# Patient Record
Sex: Male | Born: 1955 | Race: Black or African American | Hispanic: No | Marital: Single | Smoking: Never smoker
Health system: Southern US, Community
[De-identification: ages and names within clinical notes are randomized; demographics above are authoritative.]

## PROBLEM LIST (undated history)

## (undated) DIAGNOSIS — I10 Essential (primary) hypertension: Secondary | ICD-10-CM

## (undated) DIAGNOSIS — R569 Unspecified convulsions: Secondary | ICD-10-CM

## (undated) DIAGNOSIS — D649 Anemia, unspecified: Secondary | ICD-10-CM

## (undated) HISTORY — DX: Anemia, unspecified: D64.9

## (undated) HISTORY — DX: Essential (primary) hypertension: I10

## (undated) HISTORY — DX: Unspecified convulsions: R56.9

---

## 2001-06-24 ENCOUNTER — Encounter: Admission: RE | Admit: 2001-06-24 | Discharge: 2001-06-24 | Payer: Self-pay | Admitting: Family Medicine

## 2001-06-24 ENCOUNTER — Encounter: Payer: Self-pay | Admitting: Family Medicine

## 2005-03-07 ENCOUNTER — Encounter: Admission: RE | Admit: 2005-03-07 | Discharge: 2005-03-07 | Payer: Self-pay | Admitting: Family Medicine

## 2011-09-05 ENCOUNTER — Encounter: Payer: Self-pay | Admitting: Gastroenterology

## 2011-10-09 ENCOUNTER — Ambulatory Visit (AMBULATORY_SURGERY_CENTER): Payer: Medicare Other

## 2011-10-09 ENCOUNTER — Encounter: Payer: Self-pay | Admitting: Gastroenterology

## 2011-10-09 VITALS — Ht 74.0 in | Wt 154.2 lb

## 2011-10-09 DIAGNOSIS — Z1211 Encounter for screening for malignant neoplasm of colon: Secondary | ICD-10-CM

## 2011-10-09 DIAGNOSIS — Z800 Family history of malignant neoplasm of digestive organs: Secondary | ICD-10-CM

## 2011-10-09 MED ORDER — PEG-KCL-NACL-NASULF-NA ASC-C 100 G PO SOLR: 1.0000 | Freq: Once | ORAL | Status: AC

## 2011-10-09 NOTE — Progress Notes (Signed)
Pt came into office today for his pre-visit prior to the colonoscopy with Dr Arlyce Dice on 10/23/11.His niece Elmarie Shiley ) was with him to help explain medical information, but will call back later with a list of meds. His brother Caryn Bee ) will come with him to his colonoscopy appt to help answer questions and explain paperwork. Ulis Rias RN

## 2011-10-23 ENCOUNTER — Ambulatory Visit (AMBULATORY_SURGERY_CENTER): Payer: Medicare Other | Admitting: Gastroenterology

## 2011-10-23 ENCOUNTER — Encounter: Payer: Self-pay | Admitting: Gastroenterology

## 2011-10-23 VITALS — BP 135/84 | HR 62 | Temp 96.3°F | Resp 17 | Ht 74.0 in | Wt 154.0 lb

## 2011-10-23 DIAGNOSIS — D126 Benign neoplasm of colon, unspecified: Secondary | ICD-10-CM

## 2011-10-23 DIAGNOSIS — Z1211 Encounter for screening for malignant neoplasm of colon: Secondary | ICD-10-CM

## 2011-10-23 MED ORDER — SODIUM CHLORIDE 0.9 % IV SOLN: 500.0000 mL | INTRAVENOUS | Status: DC

## 2011-10-23 NOTE — Progress Notes (Signed)
Patient did not experience any of the following events: a burn prior to discharge; a fall within the facility; wrong site/side/patient/procedure/implant event; or a hospital transfer or hospital admission upon discharge from the facility. (G8907) Patient did not have preoperative order for IV antibiotic SSI prophylaxis. (G8918)  

## 2011-10-23 NOTE — Progress Notes (Signed)
The pt tolerated the colonoscopy very well. Maw   

## 2011-10-23 NOTE — Patient Instructions (Signed)
YOU HAD AN ENDOSCOPIC PROCEDURE TODAY AT THE [facility]: Refer to the procedure report that was given to you for any specific questions about what was found during the examination.  If the procedure report does not answer your questions, please call your gastroenterologist to clarify.  If you requested that your care partner not be given the details of your procedure findings, then the procedure report has been included in a sealed envelope for you to review at your convenience later.  YOU SHOULD EXPECT: Some feelings of bloating in the abdomen. Passage of more gas than usual.  Walking can help get rid of the air that was put into your GI tract during the procedure and reduce the bloating. If you had a lower endoscopy (such as a colonoscopy or flexible sigmoidoscopy) you may notice spotting of blood in your stool or on the toilet paper. If you underwent a bowel prep for your procedure, then you may not have a normal bowel movement for a few days.  DIET: Your first meal following the procedure should be a light meal and then it is ok to progress to your normal diet.  A half-sandwich or bowl of soup is an example of a good first meal.  Heavy or fried foods are harder to digest and may make you feel nauseous or bloated.  Likewise meals heavy in dairy and vegetables can cause extra gas to form and this can also increase the bloating.  Drink plenty of fluids but you should avoid alcoholic beverages for 24 hours.  ACTIVITY: Your care partner should take you home directly after the procedure.  You should plan to take it easy, moving slowly for the rest of the day.  You can resume normal activity the day after the procedure however you should NOT DRIVE or use heavy machinery for 24 hours (because of the sedation medicines used during the test).    SYMPTOMS TO REPORT IMMEDIATELY: A gastroenterologist can be reached at any hour.  During normal business hours, 8:30 AM to 5:00 PM Monday through Friday,  call [phone].  After hours and on weekends, please call the GI answering service at [phone] who will take a message and have the physician on call contact you.   Following lower endoscopy (colonoscopy or flexible sigmoidoscopy):  Excessive amounts of blood in the stool  Significant tenderness or worsening of abdominal pains  Swelling of the abdomen that is new, acute  Fever of 100F or higher    FOLLOW UP: If any biopsies were taken you will be contacted by phone or by letter within the next 1-3 weeks.  Call your gastroenterologist if you have not heard about the biopsies in 3 weeks.  Our staff will call the home number listed on your records the next business day following your procedure to check on you and address any questions or concerns that you may have at that time regarding the information given to you following your procedure. This is a courtesy call and so if there is no answer at the home number and we have not heard from you through the emergency physician on call, we will assume that you have returned to your regular daily activities without incident.  SIGNATURES/CONFIDENTIALITY: You and/or your care partner have signed paperwork which will be entered into your electronic medical record.  These signatures attest to the fact that that the information above on your After Visit Summary has been reviewed and is understood.  Full responsibility of the confidentiality   of this discharge information lies with you and/or your care-partner.     

## 2011-10-23 NOTE — Progress Notes (Signed)
Propofol was administered by Sheila Camp, CRNA.  Maw   

## 2011-10-23 NOTE — Op Note (Signed)
[facility] [address] [place], [place]  [address]  COLONOSCOPY PROCEDURE REPORT  PATIENT:  Gregory, Spence  MR#:  [id number] BIRTHDATE:  [birth year], 55 yrs. old  GENDER:  male ENDOSCOPIST:  Barbette Hair. Arlyce Dice, MD REF. BY:  Clyda Greener, M.D. PROCEDURE DATE:  10/23/2011 PROCEDURE:  Colonoscopy with snare polypectomy ASA CLASS:  Class II INDICATIONS:  Routine Risk Screening MEDICATIONS:   MAC sedation, administered by CRNA propofol 250mg IV  DESCRIPTION OF PROCEDURE:   After the risks benefits and alternatives of the procedure were thoroughly explained, informed consent was obtained.  Digital rectal exam was performed and revealed no abnormalities.   The LB PCF-[id number] endoscope was introduced through the anus and advanced to the cecum, which was identified by both the appendix and ileocecal valve, without limitations.  The quality of the prep was excellent, using MoviPrep.  The instrument was then slowly withdrawn as the colon was fully examined. <<PROCEDUREIMAGES>>  FINDINGS:  A sessile polyp was found. It was 3 mm in size. Flat polyp Polyp was snared without cautery. Retrieval was successful (see image3). snare polyp  This was otherwise a normal examination of the colon (see image1 and image4).   Retroflexed views in the rectum revealed no abnormalities.    The time to cecum =  1) 3.75 minutes. The scope was then withdrawn in  1) 12.75  minutes from the cecum and the procedure completed. COMPLICATIONS:  None ENDOSCOPIC IMPRESSION: 1) 3 mm sessile polyp 2) Otherwise normal examination RECOMMENDATIONS: 1) If the polyp(s) removed today are proven to be adenomatous (pre-cancerous) polyps, you will need a repeat colonoscopy in 5 years. Otherwise you should continue to follow colorectal cancer screening guidelines for "routine risk" patients with colonoscopy in 10 years. You will receive a letter within 1-2 weeks with the results of your biopsy as well as final  recommendations. Please call my office if you have not received a letter after 3 weeks. REPEAT EXAM:   You will receive a letter from Dr. Arlyce Dice in 1-2 weeks, after reviewing the final pathology, with followup recommendations.  ______________________________ Barbette Hair Arlyce Dice, MD  CC:  n. eSIGNED:   Barbette Hair. Jenasis Straley at 10/23/2011 10:45 AM  Gardiner Sleeper, [phone]

## 2011-10-24 ENCOUNTER — Telehealth: Payer: Self-pay | Admitting: *Deleted

## 2011-10-24 NOTE — Telephone Encounter (Signed)
  Follow up Call-  Call back number 10/23/2011  Post procedure Call Back phone  # [phone]  Permission to leave phone message Yes     Pt still a sleep, left message to have him call us back with any questions or problems.

## 2011-10-31 ENCOUNTER — Encounter: Payer: Self-pay | Admitting: Gastroenterology

## 2012-11-19 ENCOUNTER — Ambulatory Visit: Payer: Medicare Other | Admitting: Internal Medicine

## 2012-11-24 ENCOUNTER — Ambulatory Visit: Payer: Medicare Other | Admitting: Internal Medicine

## 2013-09-25 ENCOUNTER — Encounter (INDEPENDENT_AMBULATORY_CARE_PROVIDER_SITE_OTHER): Payer: Self-pay

## 2013-09-25 ENCOUNTER — Ambulatory Visit (INDEPENDENT_AMBULATORY_CARE_PROVIDER_SITE_OTHER): Payer: Medicare Other | Admitting: Diagnostic Neuroimaging

## 2013-09-25 ENCOUNTER — Ambulatory Visit: Payer: Medicare Other | Admitting: Diagnostic Neuroimaging

## 2013-09-25 ENCOUNTER — Encounter: Payer: Self-pay | Admitting: Diagnostic Neuroimaging

## 2013-09-25 VITALS — BP 126/74 | HR 57 | Temp 98.3°F | Ht 72.0 in | Wt 160.0 lb

## 2013-09-25 DIAGNOSIS — F70 Mild intellectual disabilities: Secondary | ICD-10-CM | POA: Insufficient documentation

## 2013-09-25 DIAGNOSIS — G40909 Epilepsy, unspecified, not intractable, without status epilepticus: Secondary | ICD-10-CM

## 2013-09-25 NOTE — Patient Instructions (Signed)
Continue current medications. 

## 2013-09-25 NOTE — Progress Notes (Signed)
[facility]  PATIENT: Gregory Spence DOB: June 29, 1955  REFERRING CLINICIAN: Blount HISTORY FROM: patient, twin brother (monozygotic), girlfriend REASON FOR VISIT: new consult   HISTORICAL  CHIEF COMPLAINT:  Chief Complaint  Patient presents with  . Seizures    HISTORY OF PRESENT ILLNESS:   58 year old male with history of mild mental retardation, static encephalopathy, mild congential left hemiparesis, seizure disorder, here to establish again for seizure disorder management.  Patient takes carbamazepine 200 mg tablets, 2.5 tablets twice a day. Patient has not had a seizure in over 20 years (last seizure 1990?). Otherwise patient is doing well. 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  First seizure of life around age 59 years old, with generalized convulsive seizures. No warning sign for seizures. Patient was initially on Dilantin and then transition to carbamazepine. No seizure since starting carbamazepine.  REVIEW OF SYSTEMS: Full 14 system review of systems performed and notable only for confusion, headache seizure d/o diarrhea weight loss poor appetite.  ALLERGIES: No Known Allergies  HOME MEDICATIONS: Outpatient Prescriptions Prior to Visit  Medication Sig Dispense Refill  . atenolol (TENORMIN) 25 MG tablet       . atorvastatin (LIPITOR) 10 MG tablet Take 10 mg by mouth daily.      . carbamazepine (TEGRETOL) 200 MG tablet Take 200 mg by mouth daily.      . Fe Fumarate-B12-Vit C-FA-IFC (FEROCON PO) Take by mouth daily.      . hydrochlorothiazide (HYDRODIURIL) 25 MG tablet Take 25 mg by mouth daily.       No facility-administered medications prior to visit.    PAST MEDICAL HISTORY: Past Medical History  Diagnosis Date  . Seizures   . Hypertension     PAST SURGICAL HISTORY: History reviewed. No pertinent past surgical history.  FAMILY HISTORY: Family History  Problem Relation Age of Onset  . Heart disease Mother   . Diabetes Mother     . Other Mother     infection  . Colon cancer Sister   . Colon cancer Brother     SOCIAL HISTORY:  History   Social History  . Marital Status: Single    Spouse Name: N/A    Number of Children: 0  . Years of Education: 12th   Occupational History  .     Social History Main Topics  . Smoking status: Never Smoker   . Smokeless tobacco: Never Used  . Alcohol Use: No  . Drug Use: No  . Sexual Activity: Not on file   Other Topics Concern  . Not on file   Social History Narrative   Patient lives at home with father, twin brother, sister.   Caffeine Use: 2 sodas daily   Never used EtOH, tobacco or illicit drugs.   Attended special education classes; graduated from 12th grade.     PHYSICAL EXAM  Filed Vitals:   09/25/13 1000  BP: 126/74  Pulse: 57  Temp: 98.3 F (36.8 C)  TempSrc: Oral  Height: 6' (1.829 m)  Weight: 160 lb (72.576 kg)    Not recorded    Body mass index is 21.7 kg/(m^2).  GENERAL EXAM: Patient is in no distress; well developed, nourished and groomed; neck is supple  CARDIOVASCULAR: Regular rate and rhythm, no murmurs, no carotid bruits  NEUROLOGIC: MENTAL STATUS: awake, alert, SLOW RESPONSES, MILD MR, PLEASANT AND APPROPRIATE. CRANIAL NERVE: pupils equal and reactive to light, visual fields full to confrontation, extraocular muscles intact, no nystagmus, facial sensation and strength  symmetric, hearing intact, palate elevates symmetrically, uvula midline, shoulder shrug symmetric, tongue midline. MOTOR: normal bulk and tone, full strength in the BUE, BLE SENSORY: normal and symmetric to light touch, pinprick, temperature, vibration and proprioception COORDINATION: finger-nose-finger, fine finger movements, SLIGHTLY CLUMSY IN LEFT HAND/ARM REFLEXES: deep tendon reflexes present and symmetric GAIT/STATION: narrow based gait; romberg is negative    DIAGNOSTIC DATA (LABS, IMAGING, TESTING) - I reviewed patient records, labs, notes, testing  and imaging myself where available.  No results found for this basename: WBC, HGB, HCT, MCV, PLT   No results found for this basename: na, k, cl, co2, glucose, bun, creatinine, calcium, prot, albumin, ast, alt, alkphos, bilitot, gfrnonaa, gfraa   No results found for this basename: CHOL, HDL, LDLCALC, LDLDIRECT, TRIG, CHOLHDL   No results found for this basename: HGBA1C   No results found for this basename: VITAMINB12   No results found for this basename: TSH    ASSESSMENT AND PLAN  58 y.o. year old male here with mild mental retardation, static encephalopathy, mild congential left hemiparesis, seizure disorder. Doing well on CBZ 500mg  BID (=200mg  tabs --> 2.5 tabs BID). No seizures since 1990. Would recommend continuing current dose. Needs annual CBC, LFTs.   PLAN: - continue current medications (CBZ 500mg  BID (=200mg  tabs --> 2.5 tabs BID)  Return in about 1 year (around 09/26/2014).   Penni Bombard, MD 09/29/5679, 27:51 AM Certified in Neurology, Neurophysiology and Neuroimaging  [facility] [address] [place], [place] [address] [phone]

## 2013-11-19 ENCOUNTER — Telehealth: Payer: Self-pay | Admitting: Internal Medicine

## 2013-11-19 NOTE — Telephone Encounter (Signed)
C/D 11/19/13 for appt. 12/07/13

## 2013-11-19 NOTE — Telephone Encounter (Signed)
S/W PATIENT AND GAVE NP APPT FOR 06/15 @ 1:30 W/DR. CHISM.  REFERRING DR. Ollen Gross BLOUNT DX- LEUKOPENIA/ANEMIA WELCOME PACKET  MAILED.

## 2013-12-07 ENCOUNTER — Ambulatory Visit: Payer: Medicare Other

## 2013-12-07 ENCOUNTER — Other Ambulatory Visit: Payer: Medicare Other

## 2013-12-14 ENCOUNTER — Other Ambulatory Visit: Payer: Medicare Other

## 2013-12-14 ENCOUNTER — Ambulatory Visit: Payer: Medicare Other

## 2013-12-17 ENCOUNTER — Ambulatory Visit (HOSPITAL_BASED_OUTPATIENT_CLINIC_OR_DEPARTMENT_OTHER): Payer: Medicare Other | Admitting: Internal Medicine

## 2013-12-17 ENCOUNTER — Telehealth: Payer: Self-pay | Admitting: Internal Medicine

## 2013-12-17 ENCOUNTER — Other Ambulatory Visit (HOSPITAL_BASED_OUTPATIENT_CLINIC_OR_DEPARTMENT_OTHER): Payer: Medicare Other

## 2013-12-17 ENCOUNTER — Ambulatory Visit: Payer: Medicare Other

## 2013-12-17 ENCOUNTER — Other Ambulatory Visit: Payer: Self-pay | Admitting: Internal Medicine

## 2013-12-17 ENCOUNTER — Encounter: Payer: Self-pay | Admitting: Internal Medicine

## 2013-12-17 VITALS — BP 133/70 | HR 65 | Temp 97.2°F | Resp 18 | Ht 72.0 in | Wt 158.4 lb

## 2013-12-17 DIAGNOSIS — D649 Anemia, unspecified: Secondary | ICD-10-CM

## 2013-12-17 DIAGNOSIS — D72819 Decreased white blood cell count, unspecified: Secondary | ICD-10-CM

## 2013-12-17 DIAGNOSIS — F79 Unspecified intellectual disabilities: Secondary | ICD-10-CM

## 2013-12-17 LAB — CBC WITH DIFFERENTIAL/PLATELET
BASO%: 0.8 % (ref 0.0–2.0)
BASOS ABS: 0 10*3/uL (ref 0.0–0.1)
EOS%: 6.5 % (ref 0.0–7.0)
Eosinophils Absolute: 0.3 10*3/uL (ref 0.0–0.5)
HCT: 34.6 % — ABNORMAL LOW (ref 38.4–49.9)
HEMOGLOBIN: 11.8 g/dL — AB (ref 13.0–17.1)
LYMPH#: 1.3 10*3/uL (ref 0.9–3.3)
LYMPH%: 33.5 % (ref 14.0–49.0)
MCH: 28.6 pg (ref 27.2–33.4)
MCHC: 34.1 g/dL (ref 32.0–36.0)
MCV: 84 fL (ref 79.3–98.0)
MONO#: 0.4 10*3/uL (ref 0.1–0.9)
MONO%: 10.1 % (ref 0.0–14.0)
NEUT#: 1.9 10*3/uL (ref 1.5–6.5)
NEUT%: 49.1 % (ref 39.0–75.0)
Platelets: 301 10*3/uL (ref 140–400)
RBC: 4.12 10*6/uL — ABNORMAL LOW (ref 4.20–5.82)
RDW: 12.9 % (ref 11.0–14.6)
WBC: 3.9 10*3/uL — AB (ref 4.0–10.3)
nRBC: 0 % (ref 0–0)

## 2013-12-17 LAB — COMPREHENSIVE METABOLIC PANEL (CC13)
ALT: 20 U/L (ref 0–55)
ANION GAP: 8 meq/L (ref 3–11)
AST: 25 U/L (ref 5–34)
Albumin: 3.9 g/dL (ref 3.5–5.0)
Alkaline Phosphatase: 83 U/L (ref 40–150)
BUN: 6.8 mg/dL — ABNORMAL LOW (ref 7.0–26.0)
CALCIUM: 9.3 mg/dL (ref 8.4–10.4)
CO2: 27 meq/L (ref 22–29)
CREATININE: 1 mg/dL (ref 0.7–1.3)
Chloride: 105 mEq/L (ref 98–109)
GLUCOSE: 91 mg/dL (ref 70–140)
Potassium: 4.1 mEq/L (ref 3.5–5.1)
Sodium: 140 mEq/L (ref 136–145)
TOTAL PROTEIN: 7.6 g/dL (ref 6.4–8.3)
Total Bilirubin: 0.26 mg/dL (ref 0.20–1.20)

## 2013-12-17 LAB — LACTATE DEHYDROGENASE (CC13): LDH: 167 U/L (ref 125–245)

## 2013-12-17 LAB — IRON AND TIBC CHCC
%SAT: 54 % (ref 20–55)
Iron: 121 ug/dL (ref 42–163)
TIBC: 224 ug/dL (ref 202–409)
UIBC: 103 ug/dL — ABNORMAL LOW (ref 117–376)

## 2013-12-17 LAB — FERRITIN CHCC: Ferritin: 470 ng/ml — ABNORMAL HIGH (ref 22–316)

## 2013-12-17 NOTE — Progress Notes (Signed)
Checked in new patient with no financial issues. He has appt card and has not been out of the country.

## 2013-12-17 NOTE — Progress Notes (Signed)
[facility] [phone]   [phone]  NEW PATIENT EVALUATION   Name: Gregory Spence Date: 12/18/2013 MRN: [id number] DOB: 1955-08-27  PCP: [name], MD   REFERRING PHYSICIAN: [name], *  REASON FOR REFERRAL: Anemia NOS   HISTORY OF PRESENT ILLNESS:Gregory Spence is a 58 y.o. male who is being evaluated at the request of Dr. Kennon Holter for mild anemia.   He is accompanied by his caretaker Laureen Abrahams. He has a longstanding history of mental retardation.   History relied on by medical records.  His last visit with Dr. Kennon Holter was 11/05/2013 for a general follow up visit. He had a low white cell count and low red cell count and hemoglobin and hematocrit and was referred to [place] due to consistency of his WBC and low RBC counts. His WBC on 10/29/2013 was 3.5; Hemoglobin of 11.4; MCV of 82.8.  RDW of 14 and platelet count of 340.  His comprehensive metabolic panel was within normal limits.  He denies any symptoms of anemia including chest pain or shortness of breath. He also denies fatigue as well. 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  He has not gone for the past two months due to lack of additional events required.  His TSH was normal at 1.103 and PSA was 0.88. A prior CBC demonstrated a WBC of 3.7 and hemoglobin of 12.1 with a normal plt count.  CMP was within normal limits. He denies recurrent infections or fevers or chills. His does endorse losing several lbs over the past six months.  He could not quantify.  He denies recent hospitalizations or emergency room visits.  He denies picca.  He had a colonoscopy two years ago on 10/23/2011 by Dr. Deatra Ina with one 3 mm sessile polyp removed which was benign. He reports his mother and brother and sister had malignancies but unsure of the type.    PAST MEDICAL HISTORY:  has a past medical history of Seizures and Hypertension.     PAST SURGICAL HISTORY:No past surgical history  on file.   CURRENT MEDICATIONS: has a current medication list which includes the following prescription(s): atenolol, atorvastatin, carbamazepine, and fe fumarate-b12-vit c-fa-ifc.   ALLERGIES: Review of patient's allergies indicates no known allergies.   SOCIAL HISTORY:  reports that he has never smoked. He has never used smokeless tobacco. He reports that he does not drink alcohol or use illicit drugs.   FAMILY HISTORY: family history includes Colon cancer in his brother and sister; Diabetes in his mother; Heart disease in his mother; Other in his mother.   LABORATORY DATA:  Results for orders placed in visit on 12/17/13 (from the past 48 hour(s))  VITAMIN B12     Status: None   Collection Time    12/17/13 10:26 AM      Result Value Ref Range   Vitamin B-12 554  211 - 911 pg/mL  FOLATE RBC     Status: Abnormal   Collection Time    12/17/13 10:26 AM      Result Value Ref Range   RBC Folate 255 (*) >280 ng/mL   Comment: We are unable to perform the hematocrit due to specimen issues (QNS,stability, frozen sample).  A default hematocrit value of 33% has beenused to calculate the RBC Folate.Reference range not established for pediatric patients.  LACTATE DEHYDROGENASE (CC13)     Status: None   Collection Time    12/17/13 10:26 AM  Result Value Ref Range   LDH 167  125 - 245 U/L  COMPREHENSIVE METABOLIC PANEL (DJ24)     Status: Abnormal   Collection Time    12/17/13 10:26 AM      Result Value Ref Range   Sodium 140  136 - 145 mEq/L   Potassium 4.1  3.5 - 5.1 mEq/L   Chloride 105  98 - 109 mEq/L   CO2 27  22 - 29 mEq/L   Glucose 91  70 - 140 mg/dl   BUN 6.8 (*) 7.0 - 26.0 mg/dL   Creatinine 1.0  0.7 - 1.3 mg/dL   Total Bilirubin 0.26  0.20 - 1.20 mg/dL   Alkaline Phosphatase 83  40 - 150 U/L   AST 25  5 - 34 U/L   ALT 20  0 - 55 U/L   Total Protein 7.6  6.4 - 8.3 g/dL   Albumin 3.9  3.5 - 5.0 g/dL   Calcium 9.3  8.4 - 10.4 mg/dL   Anion Gap 8  3 - 11 mEq/L  FERRITIN  [facility]     Status: Abnormal   Collection Time    12/17/13 10:26 AM      Result Value Ref Range   Ferritin 470 (*) 22 - 316 ng/ml  IRON AND TIBC [facility]     Status: Abnormal   Collection Time    12/17/13 10:26 AM      Result Value Ref Range   Iron 121  42 - 163 ug/dL   TIBC 224  202 - 409 ug/dL   UIBC 103 (*) 117 - 376 ug/dL   %SAT 54  20 - 55 %  CBC WITH DIFFERENTIAL     Status: Abnormal   Collection Time    12/17/13 11:04 AM      Result Value Ref Range   WBC 3.9 (*) 4.0 - 10.3 10e3/uL   NEUT# 1.9  1.5 - 6.5 10e3/uL   HGB 11.8 (*) 13.0 - 17.1 g/dL   HCT 34.6 (*) 38.4 - 49.9 %   Platelets 301  140 - 400 10e3/uL   MCV 84.0  79.3 - 98.0 fL   MCH 28.6  27.2 - 33.4 pg   MCHC 34.1  32.0 - 36.0 g/dL   RBC 4.12 (*) 4.20 - 5.82 10e6/uL   RDW 12.9  11.0 - 14.6 %   lymph# 1.3  0.9 - 3.3 10e3/uL   MONO# 0.4  0.1 - 0.9 10e3/uL   Eosinophils Absolute 0.3  0.0 - 0.5 10e3/uL   Basophils Absolute 0.0  0.0 - 0.1 10e3/uL   NEUT% 49.1  39.0 - 75.0 %   LYMPH% 33.5  14.0 - 49.0 %   MONO% 10.1  0.0 - 14.0 %   EOS% 6.5  0.0 - 7.0 %   BASO% 0.8  0.0 - 2.0 %   nRBC 0  0 - 0 %       RADIOGRAPHY: No results found.     REVIEW OF SYSTEMS:  Constitutional: Denies fevers, chills or abnormal weight loss Eyes: Denies blurriness of vision Ears, nose, mouth, throat, and face: Denies mucositis or sore throat Respiratory: Denies cough, dyspnea or wheezes Cardiovascular: Denies palpitation, chest discomfort or lower extremity swelling Gastrointestinal:  Denies nausea, heartburn or change in bowel habits Skin: Denies abnormal skin rashes Lymphatics: Denies new lymphadenopathy or easy bruising Neurological:Denies numbness, tingling or new weaknesses Behavioral/Psych: Mood is stable, no new changes  All other systems were reviewed with the patient and are  negative.  PHYSICAL EXAM:  height is 6' (1.829 m) and weight is 158 lb 6.4 oz (71.85 kg). His oral temperature is 97.2 F (36.2 C). His blood pressure  is 133/70 and his pulse is 65. His respiration is 18 and oxygen saturation is 99%.    GENERAL:alert, no distress and comfortable; well developed and well nourished.  SKIN: skin color, texture, turgor are normal, no rashes or significant lesions EYES: normal, Conjunctiva are pink and non-injected, sclera clear OROPHARYNX:no exudate, no erythema and lips, buccal mucosa, and tongue normal  NECK: supple, thyroid normal size, non-tender, without nodularity LYMPH:  no palpable lymphadenopathy in the cervical, axillary or inguinal LUNGS: clear to auscultation and percussion with normal breathing effort HEART: regular rate & rhythm and no murmurs and no lower extremity edema ABDOMEN:abdomen soft, non-tender and normal bowel sounds Musculoskeletal:no cyanosis of digits and no clubbing  NEURO: alert & oriented x 3 with fluent speech, no focal motor/sensory deficits   IMPRESSION: KA FLAMMER is a 58 y.o. male with a history of    PLAN:  1.  Anemia NOS, mild. --His hemoglobin is 1.8 with an MCV of 94 (normocytic) and a normal RDW of 12.9.  He also has very mild leukocytosis with a WBC of 3.9.  He also has a normal creatinine of 1.0.  His iron studies and ferritin are not remarkable for iron-defiency anemia.  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  He does not have an anemia of chronic kidney disease based on a normal creatinine clearance.  We will recommend repeat studies in one month to trend hemoglobin and we will check CRP and ESR to rule out inflammatory causes. He is asymptomatic presently for symptoms of anemia. We will order a peripheral blood smear for review.  2. Leukopenia, mild. --His WBCs are 3.9.  He denies recurrent infections.  In the setting of #1, we will monitor to rule out a primary bone marrow disorder.   3.  Follow up. --Patient will have repeat CBC in one month and upon visit in 2 months with CRP, ESR and (ANA if warranted).    All  questions were answered. The patient knows to call the clinic with any problems, questions or concerns. We can certainly see the patient much sooner if necessary.  I spent 25 minutes counseling the patient face to face. The total time spent in the appointment was 45 minutes.    CHISM, DAVID, MD 12/18/2013 11:43 AM

## 2013-12-17 NOTE — Telephone Encounter (Signed)
Gave pt appt for lab in 1 month and 2 month lab and MD

## 2013-12-17 NOTE — Patient Instructions (Signed)
Anemia, Nonspecific Anemia is a condition in which the concentration of red blood cells or hemoglobin in the blood is below normal. Hemoglobin is a substance in red blood cells that carries oxygen to the tissues of the body. Anemia results in not enough oxygen reaching these tissues.  CAUSES  Common causes of anemia include:   Excessive bleeding. Bleeding may be internal or external. This includes excessive bleeding from periods (in women) or from the intestine.   Poor nutrition.   Chronic kidney, thyroid, and liver disease.  Bone marrow disorders that decrease red blood cell production.  Cancer and treatments for cancer.  HIV, AIDS, and their treatments.  Spleen problems that increase red blood cell destruction.  Blood disorders.  Excess destruction of red blood cells due to infection, medicines, and autoimmune disorders. SIGNS AND SYMPTOMS   Minor weakness.   Dizziness.   Headache.  Palpitations.   Shortness of breath, especially with exercise.   Paleness.  Cold sensitivity.  Indigestion.  Nausea.  Difficulty sleeping.  Difficulty concentrating. Symptoms may occur suddenly or they may develop slowly.  DIAGNOSIS  Additional blood tests are often needed. These help your health care provider determine the best treatment. Your health care provider will check your stool for blood and look for other causes of blood loss.  TREATMENT  Treatment varies depending on the cause of the anemia. Treatment can include:   Supplements of iron, vitamin B12, or folic acid.   Hormone medicines.   A blood transfusion. This may be needed if blood loss is severe.   Hospitalization. This may be needed if there is significant continual blood loss.   Dietary changes.  Spleen removal. HOME CARE INSTRUCTIONS Keep all follow-up appointments. It often takes many weeks to correct anemia, and having your health care provider check on your condition and your response to  treatment is very important. SEEK IMMEDIATE MEDICAL CARE IF:   You develop extreme weakness, shortness of breath, or chest pain.   You become dizzy or have trouble concentrating.  You develop heavy vaginal bleeding.   You develop a rash.   You have bloody or black, tarry stools.   You faint.   You vomit up blood.   You vomit repeatedly.   You have abdominal pain.  You have a fever or persistent symptoms for more than 2-3 days.   You have a fever and your symptoms suddenly get worse.   You are dehydrated.  MAKE SURE YOU:  Understand these instructions.  Will watch your condition.  Will get help right away if you are not doing well or get worse. Document Released: 07/19/2004 Document Revised: 02/11/2013 Document Reviewed: 12/05/2012 Exit[employer] Patient Information 2015 [employer]. This information is not intended to replace advice given to you by your health care provider. Make sure you discuss any questions you have with your health care provider.  

## 2013-12-18 DIAGNOSIS — D72819 Decreased white blood cell count, unspecified: Secondary | ICD-10-CM | POA: Insufficient documentation

## 2013-12-18 DIAGNOSIS — D649 Anemia, unspecified: Secondary | ICD-10-CM | POA: Insufficient documentation

## 2013-12-18 LAB — FOLATE RBC: RBC Folate: 255 ng/mL — ABNORMAL LOW (ref >280)

## 2013-12-18 LAB — VITAMIN B12: VITAMIN B 12: 554 pg/mL (ref 211–911)

## 2013-12-18 MED ORDER — FOLIC ACID 1 MG PO TABS: 1.0000 mg | ORAL_TABLET | Freq: Every day | ORAL | Status: DC

## 2013-12-18 NOTE — Addendum Note (Signed)
Addended byJuliann Mule, DAVID on: 12/18/2013 11:58 AM   Modules accepted: Orders

## 2013-12-18 NOTE — Progress Notes (Signed)
He does appear to have low folate.  We will start folic 1 mg daily.

## 2014-01-12 ENCOUNTER — Telehealth: Payer: Self-pay | Admitting: Internal Medicine

## 2014-01-12 NOTE — Telephone Encounter (Signed)
pt called to r/s appt time...done pt aware of new time

## 2014-01-14 ENCOUNTER — Other Ambulatory Visit (HOSPITAL_BASED_OUTPATIENT_CLINIC_OR_DEPARTMENT_OTHER): Payer: Medicare Other

## 2014-01-14 DIAGNOSIS — D649 Anemia, unspecified: Secondary | ICD-10-CM

## 2014-01-14 LAB — CBC WITH DIFFERENTIAL/PLATELET
BASO%: 1.3 % (ref 0.0–2.0)
Basophils Absolute: 0.1 10*3/uL (ref 0.0–0.1)
EOS%: 8 % — AB (ref 0.0–7.0)
Eosinophils Absolute: 0.3 10*3/uL (ref 0.0–0.5)
HCT: 33.2 % — ABNORMAL LOW (ref 38.4–49.9)
HGB: 10.7 g/dL — ABNORMAL LOW (ref 13.0–17.1)
LYMPH%: 38.3 % (ref 14.0–49.0)
MCH: 28 pg (ref 27.2–33.4)
MCHC: 32.2 g/dL (ref 32.0–36.0)
MCV: 86.8 fL (ref 79.3–98.0)
MONO#: 0.3 10*3/uL (ref 0.1–0.9)
MONO%: 8.9 % (ref 0.0–14.0)
NEUT#: 1.7 10*3/uL (ref 1.5–6.5)
NEUT%: 43.5 % (ref 39.0–75.0)
PLATELETS: 283 10*3/uL (ref 140–400)
RBC: 3.82 10*6/uL — AB (ref 4.20–5.82)
RDW: 14.1 % (ref 11.0–14.6)
WBC: 3.9 10*3/uL — ABNORMAL LOW (ref 4.0–10.3)
lymph#: 1.5 10*3/uL (ref 0.9–3.3)

## 2014-01-14 LAB — C-REACTIVE PROTEIN

## 2014-01-14 LAB — SEDIMENTATION RATE: Sed Rate: 5 mm/hr (ref 0–16)

## 2014-01-15 ENCOUNTER — Other Ambulatory Visit: Payer: Medicare Other

## 2014-02-16 ENCOUNTER — Encounter: Payer: Self-pay | Admitting: Internal Medicine

## 2014-02-16 ENCOUNTER — Ambulatory Visit (HOSPITAL_BASED_OUTPATIENT_CLINIC_OR_DEPARTMENT_OTHER): Payer: Medicare Other | Admitting: Internal Medicine

## 2014-02-16 ENCOUNTER — Other Ambulatory Visit (HOSPITAL_BASED_OUTPATIENT_CLINIC_OR_DEPARTMENT_OTHER): Payer: Medicare Other

## 2014-02-16 VITALS — BP 137/76 | HR 58 | Temp 98.2°F | Resp 19 | Ht 72.0 in | Wt 162.4 lb

## 2014-02-16 DIAGNOSIS — D649 Anemia, unspecified: Secondary | ICD-10-CM

## 2014-02-16 DIAGNOSIS — D72819 Decreased white blood cell count, unspecified: Secondary | ICD-10-CM

## 2014-02-16 DIAGNOSIS — F79 Unspecified intellectual disabilities: Secondary | ICD-10-CM

## 2014-02-16 LAB — CBC WITH DIFFERENTIAL/PLATELET
BASO%: 0.9 % (ref 0.0–2.0)
Basophils Absolute: 0 10*3/uL (ref 0.0–0.1)
EOS%: 6.6 % (ref 0.0–7.0)
Eosinophils Absolute: 0.3 10*3/uL (ref 0.0–0.5)
HCT: 34.8 % — ABNORMAL LOW (ref 38.4–49.9)
HGB: 11.3 g/dL — ABNORMAL LOW (ref 13.0–17.1)
LYMPH#: 1.4 10*3/uL (ref 0.9–3.3)
LYMPH%: 35.6 % (ref 14.0–49.0)
MCH: 28.2 pg (ref 27.2–33.4)
MCHC: 32.4 g/dL (ref 32.0–36.0)
MCV: 87.1 fL (ref 79.3–98.0)
MONO#: 0.5 10*3/uL (ref 0.1–0.9)
MONO%: 11.4 % (ref 0.0–14.0)
NEUT#: 1.8 10*3/uL (ref 1.5–6.5)
NEUT%: 45.5 % (ref 39.0–75.0)
Platelets: 311 10*3/uL (ref 140–400)
RBC: 4 10*6/uL — AB (ref 4.20–5.82)
RDW: 14.1 % (ref 11.0–14.6)
WBC: 4 10*3/uL (ref 4.0–10.3)

## 2014-02-16 NOTE — Progress Notes (Signed)
[facility] NOTE  [name], MD [place] [address]  DIAGNOSIS: Leukopenia  Anemia, unspecified - Plan: CBC with Differential, Basic metabolic panel (Bmet) - [facility], Folate, Serum  Chief Complaint  Patient presents with  . Anemia, unspecified    CURRENT TREATMENT:  INTERVAL HISTORY: Gregory Spence 58 y.o. male who was evaluated at the request of Dr. Kennon Holter for mild anemia on 12/17/2013.  He was last seen by me on 12/17/2013.    He is accompanied by his caretaker Laureen Abrahams. He has a longstanding history of mental retardation. History relied on by medical records. His last visit with Dr. Kennon Holter was 11/05/2013 for a general follow up visit. He had a low white cell count and low red cell count and hemoglobin and hematocrit and was referred to [place] due to consistency of his WBC and low RBC counts. His WBC on 10/29/2013 was 3.5; Hemoglobin of 11.4; MCV of 82.8. RDW of 14 and platelet count of 340. His comprehensive metabolic panel was within normal limits.  He denies any symptoms of anemia including chest pain or shortness of breath. He also denies fatigue as well.   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 He has not gone for the past two months due to lack of additional events required. His TSH was normal at 1.103 and PSA was 0.88. A prior CBC demonstrated a WBC of 3.7 and hemoglobin of 12.1 with a normal plt count. CMP was within normal limits. He denies recurrent infections or fevers or chills. His does endorse losing several lbs over the past six months. He could not quantify. He denies recent hospitalizations or emergency room visits. He denies picca. He had a colonoscopy two years ago on 10/23/2011 by Dr. Deatra Ina with one 3 mm sessile polyp removed which was benign. He reports his mother and brother and sister had malignancies but unsure of the type.   MEDICAL HISTORY: Past Medical History  Diagnosis  Date  . Seizures   . Hypertension     INTERIM HISTORY: has Mild mental retardation; Seizure disorder; Anemia, unspecified; and Leukopenia on his problem list.    ALLERGIES:  has No Known Allergies.  MEDICATIONS: has a current medication list which includes the following prescription(s): atenolol, atorvastatin, carbamazepine, and folic acid.  SURGICAL HISTORY: No past surgical history on file.  REVIEW OF SYSTEMS:   Constitutional: Denies fevers, chills or abnormal weight loss Eyes: Denies blurriness of vision Ears, nose, mouth, throat, and face: Denies mucositis or sore throat Respiratory: Denies cough, dyspnea or wheezes Cardiovascular: Denies palpitation, chest discomfort or lower extremity swelling Gastrointestinal:  Denies nausea, heartburn or change in bowel habits Skin: Denies abnormal skin rashes Lymphatics: Denies new lymphadenopathy or easy bruising Neurological:Denies numbness, tingling or new weaknesses Behavioral/Psych: Mood is stable, no new changes  All other systems were reviewed with the patient and are negative.  PHYSICAL EXAMINATION: ECOG PERFORMANCE STATUS: 0 - Asymptomatic  Blood pressure 137/76, pulse 58, temperature 98.2 F (36.8 C), temperature source Oral, resp. rate 19, height 6' (1.829 m), weight 162 lb 6.4 oz (73.664 kg).  GENERAL:alert, no distress and comfortable SKIN: skin color, texture, turgor are normal, no rashes or significant lesions EYES: normal, Conjunctiva are pink and non-injected, sclera clear OROPHARYNX:no exudate, no erythema and lips, buccal mucosa, and tongue normal  NECK: supple, thyroid normal size, non-tender, without nodularity LYMPH:  no palpable lymphadenopathy in the cervical, axillary or supraclavicular LUNGS:  clear to auscultation with normal breathing effort, no wheezes or rhonchi HEART: regular rate & rhythm and no murmurs and no lower extremity edema ABDOMEN:abdomen soft, non-tender and normal bowel  sounds Musculoskeletal:no cyanosis of digits and no clubbing  NEURO: alert & oriented x 3 with fluent speech, no focal motor/sensory deficits  Labs:  Lab Results  Component Value Date   WBC 4.0 02/16/2014   HGB 11.3* 02/16/2014   HCT 34.8* 02/16/2014   MCV 87.1 02/16/2014   PLT 311 02/16/2014   NEUTROABS 1.8 02/16/2014      Chemistry      Component Value Date/Time   NA 140 12/17/2013 1026   K 4.1 12/17/2013 1026   CO2 27 12/17/2013 1026   BUN 6.8* 12/17/2013 1026   CREATININE 1.0 12/17/2013 1026      Component Value Date/Time   CALCIUM 9.3 12/17/2013 1026   ALKPHOS 83 12/17/2013 1026   AST 25 12/17/2013 1026   ALT 20 12/17/2013 1026   BILITOT 0.26 12/17/2013 1026     Results for ORMOND, LAZO (MRN [id number]) as of 02/16/2014 14:41  Ref. Range 12/17/2013 10:26  RBC Folate Latest Range: >280 ng/mL 255 (L)   Results for LANCELOT, ALYEA (MRN [id number]) as of 02/16/2014 14:41  Ref. Range 12/17/2013 10:26  Vitamin B-12 Latest Range: 211-911 pg/mL 554   Results for JERAY, SHUGART (MRN [id number]) as of 02/16/2014 14:41  Ref. Range 12/17/2013 10:26  Iron Latest Range: 42-163 ug/dL 121  UIBC Latest Range: 117-376 ug/dL 103 (L)  TIBC Latest Range: 202-409 ug/dL 224  %SAT Latest Range: 20-55 % 54  Ferritin Latest Range: 22-316 ng/ml 470 (H)   Results for AMEDEE, CERRONE (MRN [id number]) as of 02/16/2014 14:41  Ref. Range 01/14/2014 14:14  CRP Latest Range: <0.60 mg/dL <0.5   Results for MONTERIUS, ROLF (MRN [id number]) as of 02/16/2014 14:41  Ref. Range 01/14/2014 14:14  Sed Rate Latest Range: 0-16 mm/hr 5    CBC:  Recent Labs Lab 02/16/14 0922  WBC 4.0  NEUTROABS 1.8  HGB 11.3*  HCT 34.8*  MCV 87.1  PLT 311   Studies:  No results found.   RADIOGRAPHIC STUDIES: No results found.  ASSESSMENT: Gregory Spence 58 y.o. male with a history of Leukopenia  Anemia, unspecified - Plan: CBC with Differential, Basic metabolic panel (Bmet) - [facility], Folate, Serum   PLAN:   1. Anemia NOS,  mild.  --His hemoglobin is 11.3 stable from 11.8 on 12/17/2013 with an MCV of 87.1(normocytic) and a normal RDW of 12.9.  He also has a normal creatinine of 1.0. His iron studies and ferritin are not remarkable for iron-defiency anemia. 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 His inflammatory markers are negative. His RBC folate was low at 255. He does not have an anemia of chronic kidney disease based on a normal creatinine clearance. He is asymptomatic presently for symptoms of anemia. We will continue folic 1 mg daily and repeat labs in as needed.  He has a follow up with his PCP's office in 3 months.  We will see him back in 6 months.   All questions were answered. The patient knows to call the clinic with any problems, questions or concerns. We can certainly see the patient much sooner if necessary.  I spent 10 minutes counseling the patient face to face. The total time spent in the appointment was 15 minutes.    Clark Clowdus, MD 02/16/2014 2:45 PM

## 2014-02-16 NOTE — Patient Instructions (Signed)
Folic Acid, Vitamin B9 tablets What is this medicine? FOLIC ACID (FOE lik AS id) is a water-soluble, B complex vitamin. It is in many foods like liver, kidneys, yeast, and leafy, green vegetables. It is used to treat megaloblastic anemia and anemia from poor diet in pregnant women, babies, and children. This medicine may be used for other purposes; ask your health care provider or pharmacist if you have questions. COMMON BRAND NAME(S): Folacin, Folicet What should I tell my health care provider before I take this medicine? They need to know if you have any of these conditions: -alcoholism or alcohol cirrhosis -pernicious anemia -vitamin B12 deficient anemia -an unusual or allergic reaction to folic acid, other B vitamins, other medicines, foods, dyes, or preservatives -pregnant or trying to get pregnant -breast-feeding How should I use this medicine? Take this medicine by mouth with a glass of water. Follow the directions on your prescription label. Take your doses at regular intervals. Do not stop taking your medicine unless your doctor tells you to. Talk to your pediatrician regarding the use of this medicine in children. While this drug may be prescribed for selected conditions, precautions do apply. Overdosage: If you think you have taken too much of this medicine contact a poison control center or emergency room at once. NOTE: This medicine is only for you. Do not share this medicine with others. What if I miss a dose? If you miss a dose, take it as soon as you can. If it is almost time for your next dose, take only that dose. Do not take double or extra doses. What may interact with this medicine? -chloramphenicol -cholestyramine -medicines for seizures -methotrexate -nitrofurantoin -pyrimethamine This list may not describe all possible interactions. Give your health care provider a list of all the medicines, herbs, non-prescription drugs, or dietary supplements you use. Also tell  them if you smoke, drink alcohol, or use illegal drugs. Some items may interact with your medicine. What should I watch for while using this medicine? Visit your doctor or health care professional for regular check ups. Your doctor may order blood tests. You need to eat a proper diet even while you are taking this vitamin. Taking vitamin supplements is not a substitute for a healthy diet. Ask your doctor or health care provider for good nutrition advice. What side effects may I notice from receiving this medicine? Side effects that you should report to your doctor or health care professional as soon as possible: -allergic reactions such as skin rash or itching, hives, swelling of the lips, mouth, tongue, or throat -chest tightness or pain -wheezing or shortness of breath Side effects that usually do not require medical attention (report to your doctor or health care professional if they continue or are bothersome): -bitter or bad taste -confusion -irritable -loss of appetite -nausea -stomach gas This list may not describe all possible side effects. Call your doctor for medical advice about side effects. You may report side effects to FDA at 1-800-FDA-1088. Where should I keep my medicine? Keep out of the reach of children. Store at room temperature between 15 and 30 degrees C (59 and 86 degrees F). Protect from light. This medicine is quickly broken down and made inactive when exposed to heat or light. Throw away any unused medicine after the expiration date. NOTE: This sheet is a summary. It may not cover all possible information. If you have questions about this medicine, talk to your doctor, pharmacist, or health care provider.  2015, Elsevier/Gold Standard. (2007-10-15  17:03:56)  

## 2014-02-18 ENCOUNTER — Telehealth: Payer: Self-pay | Admitting: Internal Medicine

## 2014-02-18 NOTE — Telephone Encounter (Signed)
, °

## 2014-03-22 ENCOUNTER — Other Ambulatory Visit: Payer: Self-pay | Admitting: Internal Medicine

## 2014-07-20 ENCOUNTER — Telehealth: Payer: Self-pay | Admitting: Oncology

## 2014-07-20 NOTE — Telephone Encounter (Signed)
, °

## 2014-08-23 ENCOUNTER — Ambulatory Visit: Payer: Medicare Other

## 2014-08-23 ENCOUNTER — Other Ambulatory Visit: Payer: Medicare Other

## 2014-08-27 ENCOUNTER — Other Ambulatory Visit: Payer: Self-pay | Admitting: Nurse Practitioner

## 2014-08-27 DIAGNOSIS — D649 Anemia, unspecified: Secondary | ICD-10-CM

## 2014-08-30 ENCOUNTER — Telehealth: Payer: Self-pay | Admitting: Oncology

## 2014-08-30 ENCOUNTER — Other Ambulatory Visit: Payer: Medicaid Other

## 2014-08-30 ENCOUNTER — Ambulatory Visit: Payer: Self-pay | Admitting: Oncology

## 2014-08-30 NOTE — Telephone Encounter (Signed)
returned call and r/s missed appt....pt aware of new d.t

## 2014-09-06 ENCOUNTER — Telehealth: Payer: Self-pay | Admitting: Oncology

## 2014-09-06 NOTE — Telephone Encounter (Signed)
Pt called to r/s apt due to other issues going on. Pt confirmed labs/ov.Marland Kitchen..KJ

## 2014-09-13 ENCOUNTER — Telehealth: Payer: Self-pay | Admitting: Oncology

## 2014-09-13 ENCOUNTER — Ambulatory Visit (HOSPITAL_BASED_OUTPATIENT_CLINIC_OR_DEPARTMENT_OTHER): Payer: Medicare Other | Admitting: Oncology

## 2014-09-13 ENCOUNTER — Other Ambulatory Visit (HOSPITAL_BASED_OUTPATIENT_CLINIC_OR_DEPARTMENT_OTHER): Payer: Medicare Other

## 2014-09-13 VITALS — BP 144/67 | HR 65 | Temp 98.4°F | Resp 18 | Ht 72.0 in | Wt 155.3 lb

## 2014-09-13 DIAGNOSIS — G40909 Epilepsy, unspecified, not intractable, without status epilepticus: Secondary | ICD-10-CM

## 2014-09-13 DIAGNOSIS — D649 Anemia, unspecified: Secondary | ICD-10-CM

## 2014-09-13 DIAGNOSIS — R197 Diarrhea, unspecified: Secondary | ICD-10-CM

## 2014-09-13 DIAGNOSIS — D709 Neutropenia, unspecified: Secondary | ICD-10-CM | POA: Diagnosis not present

## 2014-09-13 DIAGNOSIS — I10 Essential (primary) hypertension: Secondary | ICD-10-CM

## 2014-09-13 LAB — CBC WITH DIFFERENTIAL/PLATELET
BASO%: 1.1 % (ref 0.0–2.0)
Basophils Absolute: 0 10*3/uL (ref 0.0–0.1)
EOS%: 4.5 % (ref 0.0–7.0)
Eosinophils Absolute: 0.1 10*3/uL (ref 0.0–0.5)
HEMATOCRIT: 35.4 % — AB (ref 38.4–49.9)
HGB: 11.3 g/dL — ABNORMAL LOW (ref 13.0–17.1)
LYMPH%: 39.5 % (ref 14.0–49.0)
MCH: 27.5 pg (ref 27.2–33.4)
MCHC: 32 g/dL (ref 32.0–36.0)
MCV: 85.9 fL (ref 79.3–98.0)
MONO#: 0.2 10*3/uL (ref 0.1–0.9)
MONO%: 8.1 % (ref 0.0–14.0)
NEUT#: 1.4 10*3/uL — ABNORMAL LOW (ref 1.5–6.5)
NEUT%: 46.8 % (ref 39.0–75.0)
PLATELETS: 265 10*3/uL (ref 140–400)
RBC: 4.12 10*6/uL — ABNORMAL LOW (ref 4.20–5.82)
RDW: 14.5 % (ref 11.0–14.6)
WBC: 3.1 10*3/uL — ABNORMAL LOW (ref 4.0–10.3)
lymph#: 1.2 10*3/uL (ref 0.9–3.3)

## 2014-09-13 NOTE — Telephone Encounter (Signed)
spoke with patient and advised on SEpt appt.Marland KitchenMarland KitchenMarland KitchenMarland Kitchenpatient ok and aware of date and time

## 2014-09-13 NOTE — Progress Notes (Signed)
  [place] OFFICE PROGRESS NOTE   Diagnosis: Anemia/leukopenia  INTERVAL HISTORY:   Gregory Spence has been followed by Dr. Juliann Mule for evaluation of anemia and mild leukopenia. He returns for a scheduled visit. He feels well. No recent infection. Good appetite. He relates weight loss to a chains in his diet. His nephew reports he has been eating a healthier diet. Gregory Spence reports several recent episodes of diarrhea. No bleeding.  Objective:  Vital signs in last 24 hours:  Blood pressure 144/67, pulse 65, temperature 98.4 F (36.9 C), temperature source Oral, resp. rate 18, height 6' (1.829 m), weight 155 lb 4.8 oz (70.444 kg), SpO2 100 %.    HEENT: Neck without mass Lymphatics: No cervical, supraclavicular, axillary, or inguinal nodes Resp: Lungs clear bilaterally Cardio: Regular rate and rhythm GI: No hepatosplenomegaly, nontender, no mass Vascular: No leg edema   Lab Results:  Lab Results  Component Value Date   WBC 3.1* 09/13/2014   HGB 11.3* 09/13/2014   HCT 35.4* 09/13/2014   MCV 85.9 09/13/2014   PLT 265 09/13/2014   NEUTROABS 1.4* 09/13/2014   Blood smear: The platelets are normal in number. The [place] morphology is unremarkable. There are moderate number of acanthocytes. The polychromasia is not increased.  Medications: I have reviewed the patient's current medications.  Assessment/Plan:  1. Normocytic anemia-stable 2. Mild neutropenia 3. Seizure disorder 4. Hypertension  Disposition:  Gregory Spence appears well. The hemoglobin is stable. The etiology of the anemia is unclear. The anemia could be related to Tegretol or malnutrition. I have a low clinical suspicion for a primary hematologic disorder such as a malignancy. The mild neutropenia could also be secondary to Tegretol or a benign normal variant.  He will return for an office visit and CBC in 6 months. We will consider obtaining a bone marrow biopsy if he develops a progressive hematologic  abnormality.  Betsy Coder, MD  09/13/2014  1:54 PM

## 2014-09-20 ENCOUNTER — Ambulatory Visit: Payer: Self-pay | Admitting: Oncology

## 2014-09-20 ENCOUNTER — Other Ambulatory Visit: Payer: Medicaid Other

## 2015-03-14 ENCOUNTER — Telehealth: Payer: Self-pay | Admitting: Oncology

## 2015-03-14 NOTE — Telephone Encounter (Signed)
pt called to r/s appt...done....pt ok and aware of new d.t °

## 2015-03-16 ENCOUNTER — Other Ambulatory Visit: Payer: Medicaid Other

## 2015-03-16 ENCOUNTER — Ambulatory Visit: Payer: Medicaid Other | Admitting: Nurse Practitioner

## 2015-03-28 ENCOUNTER — Telehealth: Payer: Self-pay | Admitting: Nurse Practitioner

## 2015-03-28 ENCOUNTER — Ambulatory Visit (HOSPITAL_BASED_OUTPATIENT_CLINIC_OR_DEPARTMENT_OTHER): Payer: Medicare Other | Admitting: Nurse Practitioner

## 2015-03-28 ENCOUNTER — Other Ambulatory Visit (HOSPITAL_BASED_OUTPATIENT_CLINIC_OR_DEPARTMENT_OTHER): Payer: Medicare Other

## 2015-03-28 VITALS — BP 135/70 | HR 62 | Temp 99.0°F | Resp 18 | Ht 72.0 in | Wt 157.3 lb

## 2015-03-28 DIAGNOSIS — D649 Anemia, unspecified: Secondary | ICD-10-CM

## 2015-03-28 DIAGNOSIS — D72819 Decreased white blood cell count, unspecified: Secondary | ICD-10-CM | POA: Diagnosis not present

## 2015-03-28 LAB — CBC WITH DIFFERENTIAL/PLATELET
BASO%: 0.9 % (ref 0.0–2.0)
BASOS ABS: 0 10*3/uL (ref 0.0–0.1)
EOS ABS: 0.1 10*3/uL (ref 0.0–0.5)
EOS%: 3.9 % (ref 0.0–7.0)
HCT: 34.9 % — ABNORMAL LOW (ref 38.4–49.9)
HEMOGLOBIN: 11.6 g/dL — AB (ref 13.0–17.1)
LYMPH#: 1.1 10*3/uL (ref 0.9–3.3)
LYMPH%: 32 % (ref 14.0–49.0)
MCH: 28.6 pg (ref 27.2–33.4)
MCHC: 33.2 g/dL (ref 32.0–36.0)
MCV: 86.1 fL (ref 79.3–98.0)
MONO#: 0.4 10*3/uL (ref 0.1–0.9)
MONO%: 10.8 % (ref 0.0–14.0)
NEUT#: 1.9 10*3/uL (ref 1.5–6.5)
NEUT%: 52.4 % (ref 39.0–75.0)
PLATELETS: 279 10*3/uL (ref 140–400)
RBC: 4.05 10*6/uL — ABNORMAL LOW (ref 4.20–5.82)
RDW: 14 % (ref 11.0–14.6)
WBC: 3.5 10*3/uL — ABNORMAL LOW (ref 4.0–10.3)

## 2015-03-28 NOTE — Progress Notes (Signed)
  [place] OFFICE PROGRESS NOTE   Diagnosis:  Anemia/leukopenia  INTERVAL HISTORY:   Gregory Spence returns as scheduled. He feels well. No interim illnesses or infections. No fevers or sweats. He reports a good appetite. No weight loss. He denies pain.  Objective:  Vital signs in last 24 hours:  Blood pressure 135/70, pulse 62, temperature 99 F (37.2 C), temperature source Oral, resp. rate 18, height 6' (1.829 m), weight 157 lb 4.8 oz (71.351 kg), SpO2 100 %.    HEENT: No thrush or ulcers. Lymphatics: No palpable cervical, supraclavicular or axillary lymph nodes. Resp: Lungs clear bilaterally. Cardio: Regular rate and rhythm. GI: Abdomen soft and nontender. No organomegaly. Vascular: No leg edema.   Lab Results:  Lab Results  Component Value Date   WBC 3.5* 03/28/2015   HGB 11.6* 03/28/2015   HCT 34.9* 03/28/2015   MCV 86.1 03/28/2015   PLT 279 03/28/2015   NEUTROABS 1.9 03/28/2015    Imaging:  No results found.  Medications: I have reviewed the patient's current medications.  Assessment/Plan: 1. Normocytic anemia-stable 2. Mild neutropenia 3. Seizure disorder 4. Hypertension   Disposition: Gregory Spence appears well. Hemoglobin and white count remain stable. We will continue to follow with observation/routine labs. He will return for a follow-up visit and CBC in 6 months. Bone marrow biopsy will be considered if he develops progressive hematologic abnormality.  Plan reviewed with Dr. Benay Spice.  Ned Card ANP/GNP-BC   03/28/2015  12:06 PM

## 2015-03-28 NOTE — Telephone Encounter (Signed)
per pof tos sch pt appt-gave pt copy of avs °

## 2015-06-30 ENCOUNTER — Ambulatory Visit (INDEPENDENT_AMBULATORY_CARE_PROVIDER_SITE_OTHER): Payer: Medicare Other | Admitting: Nurse Practitioner

## 2015-06-30 ENCOUNTER — Encounter: Payer: Self-pay | Admitting: Nurse Practitioner

## 2015-06-30 VITALS — BP 119/73 | HR 67 | Ht 72.0 in | Wt 157.4 lb

## 2015-06-30 DIAGNOSIS — F70 Mild intellectual disabilities: Secondary | ICD-10-CM

## 2015-06-30 DIAGNOSIS — G40909 Epilepsy, unspecified, not intractable, without status epilepticus: Secondary | ICD-10-CM | POA: Diagnosis not present

## 2015-06-30 MED ORDER — CARBAMAZEPINE 200 MG PO TABS: 500.0000 mg | ORAL_TABLET | Freq: Two times a day (BID) | ORAL | Status: DC

## 2015-06-30 NOTE — Patient Instructions (Signed)
Continue Tegretol at current dose will refill Follow-up yearly and when necessary Call for any seizure activity

## 2015-06-30 NOTE — Progress Notes (Signed)
[facility]  PATIENT: KERION WIEBELHAUS DOB: 24-Aug-1955   REASON FOR VISIT: Follow-up for seizure disorder and mild mental retardation HISTORY FROM: Patient    HISTORY OF PRESENT ILLNESS: Mr. Ellenberg, 60 year old male returns for follow-up. He was last seen in the office by [name] 09/25/2013 to reestablish care. He had a previous patient of ours with Dr. [name] who has left the practice. 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 Last seizure event occurred September 1990. He also has a congenital left hemiparesis. Denies any daytime drowsiness headaches visual disturbance no falls. Denies missing any doses of his medications. He returns for reevaluation. Mother passed away in Jun 19, 2014. She was primary caretaker. Labs are followed through oncology.    60 year old male with history of mild mental retardation, static encephalopathy, mild congential left hemiparesis, seizure disorder, here to establish again for seizure disorder management.  Patient takes carbamazepine 200 mg tablets, 2.5 tablets twice a day. Patient has not had a seizure in over 20 years (last seizure 1990?). Otherwise patient is doing well. 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  First seizure of life around age 71 years old, with generalized convulsive seizures. No warning sign for seizures. Patient was initially on Dilantin and then transition to carbamazepine. No seizure since starting carbamazepine.   REVIEW OF SYSTEMS: Full 14 system review of systems performed and notable only for those listed, all others are neg:  Constitutional: neg  Cardiovascular: neg Ear/Nose/Throat: neg  Skin: neg Eyes: neg Respiratory: neg Gastroitestinal: Diarrhea at times  Hematology/Lymphatic: neg  Endocrine: neg Musculoskeletal:neg Allergy/Immunology: neg Neurological: Occasional headache, speech  difficulty Psychiatric: neg Sleep : neg   ALLERGIES: No Known Allergies  HOME MEDICATIONS: Outpatient Prescriptions Prior to Visit  Medication Sig Dispense Refill  . atenolol (TENORMIN) 25 MG tablet 25 mg 2 (two) times daily.     . carbamazepine (TEGRETOL) 200 MG tablet Take 500 mg by mouth 2 (two) times daily.     . folic acid (FOLVITE) 1 MG tablet TAKE 1 TABLET (1 MG TOTAL) BY MOUTH DAILY. 30 tablet 2  . atorvastatin (LIPITOR) 10 MG tablet Take 10 mg by mouth daily.     No facility-administered medications prior to visit.    PAST MEDICAL HISTORY: Past Medical History  Diagnosis Date  . Seizures ([place])   . Hypertension     PAST SURGICAL HISTORY: No past surgical history on file.  FAMILY HISTORY: Family History  Problem Relation Age of Onset  . Heart disease Mother   . Diabetes Mother   . Other Mother     infection  . Colon cancer Sister   . Colon cancer Brother     SOCIAL HISTORY: Social History   Social History  . Marital Status: Single    Spouse Name: N/A  . Number of Children: 0  . Years of Education: 12th   Occupational History  .     Social History Main Topics  . Smoking status: Never Smoker   . Smokeless tobacco: Never Used  . Alcohol Use: No  . Drug Use: No  . Sexual Activity: Not on file   Other Topics Concern  . Not on file   Social History Narrative   Patient lives at home with father, twin brother.    Caffeine Use: 2 sodas daily   Never used EtOH, tobacco or illicit drugs.   Attended special education classes; graduated from 12th  grade.     PHYSICAL EXAM  Filed Vitals:   06/30/15 0906  BP: 119/73  Pulse: 67  Height: 6' (1.829 m)  Weight: 157 lb 6.4 oz (71.396 kg)   Body mass index is 21.34 kg/(m^2).  Generalized: Well developed, in no acute distress, well groomed  Head: normocephalic and atraumatic,. Oropharynx benign  Neck: Supple, no carotid bruits  Cardiac: Regular rate rhythm, no murmur  Musculoskeletal: No deformity     Neurological examination   Mentation: Alert oriented to time, place, slow to answer questions. Mild MR .Follows all commands,    Cranial nerve II-XII: Pupils were equal round reactive to light extraocular movements were full, visual field were full on confrontational test. Facial sensation and strength were normal. hearing was intact to finger rubbing bilaterally. Uvula tongue midline. head turning and shoulder shrug were normal and symmetric.Tongue protrusion into cheek strength was normal. Motor: normal bulk and tone, full strength in the BUE, BLE, except mild weakness left upper extremity Sensory: normal and symmetric to light touch, pinprick, and  Vibration, proprioception  Coordination: finger-nose-finger, heel-to-shin bilaterally, clumsy on the left normal on the right Reflexes: 1+ upper lower and symmetric  Gait and Station: Rising up from seated position without assistance, normal stance,  moderate stride, good arm swing, smooth turning, able to perform tiptoe, and heel walking without difficulty. Tandem gait is steady. Romberg negative  DIAGNOSTIC DATA (LABS, IMAGING, TESTING) - I reviewed patient records, labs, notes, testing and imaging myself where available.  Lab Results  Component Value Date   WBC 3.5* 03/28/2015   HGB 11.6* 03/28/2015   HCT 34.9* 03/28/2015   MCV 86.1 03/28/2015   PLT 279 03/28/2015      Component Value Date/Time   NA 140 12/17/2013 1026   K 4.1 12/17/2013 1026   CO2 27 12/17/2013 1026   GLUCOSE 91 12/17/2013 1026   BUN 6.8* 12/17/2013 1026   CREATININE 1.0 12/17/2013 1026   CALCIUM 9.3 12/17/2013 1026   PROT 7.6 12/17/2013 1026   ALBUMIN 3.9 12/17/2013 1026   AST 25 12/17/2013 1026   ALT 20 12/17/2013 1026   ALKPHOS 83 12/17/2013 1026   BILITOT 0.26 12/17/2013 1026    ASSESSMENT AND PLAN  60 y.o. year old male  has a past medical history of Seizures ([place]) and Hypertension. here to follow-up for seizure disorder. Last seizure occurred  September 1990  Continue Tegretol at current dose will refill Follow-up yearly and when necessary Call for any seizure activity Dennie Bible, [facility], [facility], APRN  [facility] [address], [place], [place] [address] [phone]

## 2015-06-30 NOTE — Progress Notes (Signed)
I reviewed note and agree with plan.   Penni Bombard, MD [id number], 0000000 PM Certified in Neurology, Neurophysiology and Neuroimaging  [facility] [address] [place], [place] [address] [phone]

## 2015-09-08 DIAGNOSIS — I10 Essential (primary) hypertension: Secondary | ICD-10-CM | POA: Diagnosis present

## 2015-09-08 DIAGNOSIS — E7800 Pure hypercholesterolemia, unspecified: Secondary | ICD-10-CM | POA: Diagnosis present

## 2015-09-26 ENCOUNTER — Ambulatory Visit (HOSPITAL_BASED_OUTPATIENT_CLINIC_OR_DEPARTMENT_OTHER): Payer: Medicare Other | Admitting: Oncology

## 2015-09-26 ENCOUNTER — Other Ambulatory Visit (HOSPITAL_BASED_OUTPATIENT_CLINIC_OR_DEPARTMENT_OTHER): Payer: Medicare Other

## 2015-09-26 VITALS — BP 160/75 | HR 67 | Temp 98.5°F | Resp 18 | Ht 72.0 in | Wt 159.2 lb

## 2015-09-26 DIAGNOSIS — D649 Anemia, unspecified: Secondary | ICD-10-CM

## 2015-09-26 DIAGNOSIS — D72819 Decreased white blood cell count, unspecified: Secondary | ICD-10-CM

## 2015-09-26 LAB — CBC WITH DIFFERENTIAL/PLATELET
BASO%: 0.6 % (ref 0.0–2.0)
Basophils Absolute: 0 10*3/uL (ref 0.0–0.1)
EOS%: 3.4 % (ref 0.0–7.0)
Eosinophils Absolute: 0.2 10*3/uL (ref 0.0–0.5)
HCT: 34.3 % — ABNORMAL LOW (ref 38.4–49.9)
HEMOGLOBIN: 11.4 g/dL — AB (ref 13.0–17.1)
LYMPH%: 28.8 % (ref 14.0–49.0)
MCH: 28.6 pg (ref 27.2–33.4)
MCHC: 33.2 g/dL (ref 32.0–36.0)
MCV: 86.2 fL (ref 79.3–98.0)
MONO#: 0.5 10*3/uL (ref 0.1–0.9)
MONO%: 9.3 % (ref 0.0–14.0)
NEUT%: 57.9 % (ref 39.0–75.0)
NEUTROS ABS: 2.9 10*3/uL (ref 1.5–6.5)
PLATELETS: 239 10*3/uL (ref 140–400)
RBC: 3.98 10*6/uL — AB (ref 4.20–5.82)
RDW: 13.6 % (ref 11.0–14.6)
WBC: 5 10*3/uL (ref 4.0–10.3)
lymph#: 1.5 10*3/uL (ref 0.9–3.3)

## 2015-09-26 NOTE — Progress Notes (Signed)
  [place] OFFICE PROGRESS NOTE   Diagnosis:  anemia  INTERVAL HISTORY:    Mr. Campus returns as scheduled. He feels well. No seizures. He reports a single episode of blood per rectum when wiping last week. He takes iron 3 days per week.  Objective:  Vital signs in last 24 hours:  Blood pressure 160/75, pulse 67, temperature 98.5 F (36.9 C), temperature source Oral, resp. rate 18, height 6' (1.829 m), weight 159 lb 3.2 oz (72.213 kg), SpO2 100 %.    HEENT:  Neck without mass Lymphatics:  No cervical, supraclavicular, or inguinal nodes. "Shotty "bilateral axillary nodes Resp:  Lungs clear bilaterally Cardio:  Regular rate and rhythm GI:  No hepatosplenomegaly , no mass Rectal: Normal tone, no mass , no blood on the examination glove, stool in the rectal vault Vascular:  No leg edema   Lab Results:  Lab Results  Component Value Date   WBC 5.0 09/26/2015   HGB 11.4* 09/26/2015   HCT 34.3* 09/26/2015   MCV 86.2 09/26/2015   PLT 239 09/26/2015   NEUTROABS 2.9 09/26/2015     Medications: I have reviewed the patient's current medications.  Assessment/Plan: 1. Normocytic anemia-stable 2.  History of Mild neutropenia 3. Seizure disorder 4. Hypertension   Disposition:   Gregory Spence is stable from a hematologic standpoint. He will return for an office visit and CBC in 8 months.  I recommended he seek medical attention for recurrent rectal bleeding.  Betsy Coder, MD  09/26/2015  12:13 PM

## 2016-05-25 ENCOUNTER — Telehealth: Payer: Self-pay | Admitting: *Deleted

## 2016-05-25 NOTE — Telephone Encounter (Signed)
Message received from [facility] stating that patient is unable to keep his scheduled appts on 05/29/16 and needs to reschedule to 06/08/16 after 2:00PM.  Message sent to scheduling.

## 2016-05-29 ENCOUNTER — Ambulatory Visit: Payer: Medicare Other | Admitting: Oncology

## 2016-05-29 ENCOUNTER — Other Ambulatory Visit: Payer: Medicare Other

## 2016-07-02 ENCOUNTER — Ambulatory Visit: Payer: Medicare Other | Admitting: Nurse Practitioner

## 2016-07-02 ENCOUNTER — Telehealth: Payer: Self-pay | Admitting: Oncology

## 2016-07-02 ENCOUNTER — Ambulatory Visit (HOSPITAL_BASED_OUTPATIENT_CLINIC_OR_DEPARTMENT_OTHER): Payer: Medicare Other | Admitting: Oncology

## 2016-07-02 ENCOUNTER — Other Ambulatory Visit (HOSPITAL_BASED_OUTPATIENT_CLINIC_OR_DEPARTMENT_OTHER): Payer: Medicare Other

## 2016-07-02 VITALS — BP 120/71 | HR 86 | Temp 97.8°F | Resp 18 | Ht 72.0 in | Wt 142.0 lb

## 2016-07-02 DIAGNOSIS — I10 Essential (primary) hypertension: Secondary | ICD-10-CM | POA: Diagnosis not present

## 2016-07-02 DIAGNOSIS — D649 Anemia, unspecified: Secondary | ICD-10-CM

## 2016-07-02 LAB — CBC WITH DIFFERENTIAL/PLATELET
BASO%: 1 % (ref 0.0–2.0)
BASOS ABS: 0 10*3/uL (ref 0.0–0.1)
EOS%: 9.3 % — AB (ref 0.0–7.0)
Eosinophils Absolute: 0.4 10*3/uL (ref 0.0–0.5)
HEMATOCRIT: 37.1 % — AB (ref 38.4–49.9)
HEMOGLOBIN: 12.2 g/dL — AB (ref 13.0–17.1)
LYMPH%: 33.6 % (ref 14.0–49.0)
MCH: 28.4 pg (ref 27.2–33.4)
MCHC: 32.9 g/dL (ref 32.0–36.0)
MCV: 86.3 fL (ref 79.3–98.0)
MONO#: 0.4 10*3/uL (ref 0.1–0.9)
MONO%: 9.5 % (ref 0.0–14.0)
NEUT#: 1.9 10*3/uL (ref 1.5–6.5)
NEUT%: 46.6 % (ref 39.0–75.0)
PLATELETS: 260 10*3/uL (ref 140–400)
RBC: 4.3 10*6/uL (ref 4.20–5.82)
RDW: 13.4 % (ref 11.0–14.6)
WBC: 4 10*3/uL (ref 4.0–10.3)
lymph#: 1.3 10*3/uL (ref 0.9–3.3)

## 2016-07-02 NOTE — Progress Notes (Signed)
  [place] OFFICE PROGRESS NOTE   Diagnosis: Anemia  INTERVAL HISTORY:   Mr. Catbagan returns as scheduled. He is accompanied by his nephew. No complaint. No bleeding. His nephew confirms Mr. Garthwaite has a good appetite and is eating his meals.  Objective:  Vital signs in last 24 hours:  Blood pressure 120/77, pulse 86, temperature 97.8 F (36.6 C), temperature source Oral, resp. rate 18, height 6' (1.829 m), weight 142 lb (64.4 kg), SpO2 100 %.    HEENT: Neck without mass Lymphatics: No cervical or supraclavicular nodes. "Shotty "bilateral axillary nodes Resp: Lungs clear bilaterally, no respiratory distress Cardio: Regular rate and rhythm GI: No hepatosplenomegaly, nontender Vascular: No leg edema   Lab Results:  Lab Results  Component Value Date   WBC 4.0 07/02/2016   HGB 12.2 (L) 07/02/2016   HCT 37.1 (L) 07/02/2016   MCV 86.3 07/02/2016   PLT 260 07/02/2016   NEUTROABS 1.9 07/02/2016      Medications: I have reviewed the patient's current medications.  Assessment/Plan: 1. Normocytic anemia-stable 2.  History of Mild neutropenia 3. Seizure disorder 4. Hypertension    Disposition:  Mr. Grumbine is stable from a hematologic standpoint. He has chronic mild normocytic anemia. He has lost a significant amount of weight compared to when he was here in April of last year, but his weight is only down 10 pounds compared to 5 years ago. His nephew reports Mr. Racca has a good appetite. He will return for an office visit and CBC in 6 months.  15 minutes were spent with the patient today. The majority of the time was used for counseling and coordination of care.  Betsy Coder, MD  07/02/2016  11:20 AM

## 2016-07-02 NOTE — Telephone Encounter (Signed)
Spoke with patient re July lab/fu.  °

## 2016-07-03 ENCOUNTER — Ambulatory Visit (INDEPENDENT_AMBULATORY_CARE_PROVIDER_SITE_OTHER): Payer: Medicare Other | Admitting: Nurse Practitioner

## 2016-07-03 ENCOUNTER — Encounter: Payer: Self-pay | Admitting: Nurse Practitioner

## 2016-07-03 VITALS — BP 110/68 | HR 70 | Ht 72.0 in | Wt 143.8 lb

## 2016-07-03 DIAGNOSIS — G40909 Epilepsy, unspecified, not intractable, without status epilepticus: Secondary | ICD-10-CM

## 2016-07-03 DIAGNOSIS — Z5181 Encounter for therapeutic drug level monitoring: Secondary | ICD-10-CM

## 2016-07-03 MED ORDER — CARBAMAZEPINE 200 MG PO TABS: 500.0000 mg | ORAL_TABLET | Freq: Two times a day (BID) | ORAL | 11 refills | Status: DC

## 2016-07-03 NOTE — Progress Notes (Signed)
[facility]  PATIENT: Gregory Spence DOB: 10-04-1955   REASON FOR VISIT: Follow-up for seizure disorder and mild mental retardation HISTORY FROM: Patient    HISTORY OF PRESENT ILLNESS: Mr. Gregory Spence, 61 year old male returns for yearly follow-up. He reestablished care here with  [name] 09/25/2013. He had a previous patient of ours with Dr. [name] who has left the practice. 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 Last seizure event occurred September 1990. He also has a congenital left hemiparesis. Denies any daytime drowsiness headaches visual disturbance no falls. Denies missing any doses of his medications. He returns for reevaluation. Mother passed away in 06/27/14. She was primary caretaker. CBC  followed through oncology for his anemia. He needs liver function testing and refills. He has no new complaints    61 year old male with history of mild mental retardation, static encephalopathy, mild congential left hemiparesis, seizure disorder, here to establish again for seizure disorder management.  Patient takes carbamazepine 200 mg tablets, 2.5 tablets twice a day. Patient has not had a seizure in over 20 years (last seizure 1990?). Otherwise patient is doing well. He continues to work for [employer] [place].  First seizure of life around age 89 years old, with generalized convulsive seizures. No warning sign for seizures. Patient was initially on Dilantin and then transition to carbamazepine. No seizure since starting carbamazepine.   REVIEW OF SYSTEMS: Full 14 system review of systems performed and notable only for those listed, all others are neg:  Constitutional: neg  Cardiovascular: neg Ear/Nose/Throat: neg  Skin: neg Eyes: neg Respiratory: neg Gastroitestinal: neg Hematology/Lymphatic: neg  Endocrine: neg Musculoskeletal:neg Allergy/Immunology:  neg Neurological: neg Psychiatric: neg Sleep : neg   ALLERGIES: No Known Allergies  HOME MEDICATIONS: Outpatient Medications Prior to Visit  Medication Sig Dispense Refill  . atenolol (TENORMIN) 25 MG tablet 25 mg 2 (two) times daily.     Marland Kitchen atorvastatin (LIPITOR) 80 MG tablet TAKE 1 TABLET BY MOUTH EVERY DAY NEED APPT FOR MORE REFILLS  0  . carbamazepine (TEGRETOL) 200 MG tablet Take 2.5 tablets (500 mg total) by mouth 2 (two) times daily. 150 tablet 11  . ferrous sulfate 325 (65 FE) MG tablet Take 325 mg by mouth every Monday, Wednesday, and Friday.    Marland Kitchen FOLIC ACID-VIT [id number] PO Take 1 tablet by mouth. Twice weekly (Tues and Sat)     No facility-administered medications prior to visit.     PAST MEDICAL HISTORY: Past Medical History:  Diagnosis Date  . Hypertension   . Seizures ([place])     PAST SURGICAL HISTORY: No past surgical history on file.  FAMILY HISTORY: Family History  Problem Relation Age of Onset  . Heart disease Mother   . Diabetes Mother   . Other Mother     infection  . Colon cancer Sister   . Colon cancer Brother     SOCIAL HISTORY: Social History   Social History  . Marital status: Single    Spouse name: N/A  . Number of children: 0  . Years of education: 12th   Occupational History  .  [employer]   Social History Main Topics  . Smoking status: Never Smoker  . Smokeless tobacco: Never Used  . Alcohol use No  . Drug use: No  . Sexual activity: Not on file   Other Topics Concern  . Not on file   Social History  Narrative   Patient lives at home with father, twin brother.    Caffeine Use: 2 sodas daily   Never used EtOH, tobacco or illicit drugs.   Attended special education classes; graduated from 12th grade.     PHYSICAL EXAM  Vitals:   07/03/16 1505  Weight: 143 lb 12.8 oz (65.2 kg)  Height: 6' (1.829 m)   Body mass index is 19.5 kg/m.  Generalized: Well developed, in no acute distress, well groomed   Head: normocephalic and atraumatic,. Oropharynx benign  Neck: Supple, no carotid bruits  Cardiac: Regular rate rhythm, no murmur  Musculoskeletal: No deformity   Neurological examination   Mentation: Alert oriented to time, place, slow to answer questions. Mild MR .Follows all commands,    Cranial nerve II-XII: Pupils were equal round reactive to light extraocular movements were full, visual field were full on confrontational test. Facial sensation and strength were normal. hearing was intact to finger rubbing bilaterally. Uvula tongue midline. head turning and shoulder shrug were normal and symmetric.Tongue protrusion into cheek strength was normal. Motor: normal bulk and tone, full strength in the BUE, BLE, except mild weakness left upper extremity Sensory: normal and symmetric to light touch,  Coordination: finger-nose-finger, heel-to-shin bilaterally, clumsy on the left normal on the right Reflexes: 1+ upper lower and symmetric  Gait and Station: Rising up from seated position without assistance, normal stance,  moderate stride, good arm swing, smooth turning, able to perform tiptoe, and heel walking without difficulty. Tandem gait is steady. Romberg negative  DIAGNOSTIC DATA (LABS, IMAGING, TESTING) - I reviewed patient records, labs, notes, testing and imaging myself where available.  Lab Results  Component Value Date   WBC 4.0 07/02/2016   HGB 12.2 (L) 07/02/2016   HCT 37.1 (L) 07/02/2016   MCV 86.3 07/02/2016   PLT 260 07/02/2016     ASSESSMENT AND PLAN  61 y.o. year old male  has a past medical history of Hypertension and Seizures ([place]). here to follow-up for seizure disorder. Last seizure occurred September 1990  PLAN:Continue Tegretol at current dose will refill Will check CMP,  CBC just done  Follow-up yearly and when necessary Call for any seizure activity Dennie Bible, [facility], [facility], [facility] [address] [place],  [place] [address] [phone]

## 2016-07-03 NOTE — Patient Instructions (Signed)
Continue Tegretol at current dose will refill Will check CMP CBC just done  Follow-up yearly and when necessary Call for any seizure activity

## 2016-07-04 ENCOUNTER — Telehealth: Payer: Self-pay | Admitting: *Deleted

## 2016-07-04 LAB — COMPREHENSIVE METABOLIC PANEL
A/G RATIO: 1.4 (ref 1.2–2.2)
ALT: 14 IU/L (ref 0–44)
AST: 15 IU/L (ref 0–40)
Albumin: 4.3 g/dL (ref 3.6–4.8)
Alkaline Phosphatase: 85 IU/L (ref 39–117)
BUN / CREAT RATIO: 17 (ref 10–24)
BUN: 16 mg/dL (ref 8–27)
CHLORIDE: 105 mmol/L (ref 96–106)
CO2: 26 mmol/L (ref 18–29)
Calcium: 9.4 mg/dL (ref 8.6–10.2)
Creatinine, Ser: 0.95 mg/dL (ref 0.76–1.27)
GFR calc non Af Amer: 86 mL/min/{1.73_m2} (ref >59)
GFR, EST AFRICAN AMERICAN: 99 mL/min/{1.73_m2} (ref >59)
Globulin, Total: 3.1 g/dL (ref 1.5–4.5)
Glucose: 90 mg/dL (ref 65–99)
POTASSIUM: 4.6 mmol/L (ref 3.5–5.2)
Sodium: 144 mmol/L (ref 134–144)
TOTAL PROTEIN: 7.4 g/dL (ref 6.0–8.5)

## 2016-07-04 NOTE — Telephone Encounter (Signed)
Per Daun Peacock, NP, spoke with patient and informed him his labs look good. He verbalized no questions, stated understanding, appreciation.

## 2016-07-16 ENCOUNTER — Other Ambulatory Visit: Payer: Self-pay | Admitting: Nurse Practitioner

## 2016-08-08 NOTE — Progress Notes (Signed)
I reviewed note and agree with plan.   VIKRAM R. PENUMALLI, MD  Certified in Neurology, Neurophysiology and Neuroimaging  [facility] [address], [address] [place], [place] [address] [phone]   

## 2016-12-31 ENCOUNTER — Other Ambulatory Visit (HOSPITAL_BASED_OUTPATIENT_CLINIC_OR_DEPARTMENT_OTHER): Payer: Medicare Other

## 2016-12-31 ENCOUNTER — Ambulatory Visit (HOSPITAL_BASED_OUTPATIENT_CLINIC_OR_DEPARTMENT_OTHER): Payer: Medicare Other | Admitting: Nurse Practitioner

## 2016-12-31 VITALS — BP 156/75 | HR 63 | Temp 97.7°F | Resp 18 | Ht 72.0 in | Wt 146.5 lb

## 2016-12-31 DIAGNOSIS — D649 Anemia, unspecified: Secondary | ICD-10-CM

## 2016-12-31 DIAGNOSIS — I10 Essential (primary) hypertension: Secondary | ICD-10-CM

## 2016-12-31 LAB — CBC WITH DIFFERENTIAL/PLATELET
BASO%: 1.1 % (ref 0.0–2.0)
BASOS ABS: 0 10*3/uL (ref 0.0–0.1)
EOS%: 6.3 % (ref 0.0–7.0)
Eosinophils Absolute: 0.2 10*3/uL (ref 0.0–0.5)
HEMATOCRIT: 34.8 % — AB (ref 38.4–49.9)
HGB: 11.6 g/dL — ABNORMAL LOW (ref 13.0–17.1)
LYMPH%: 40.4 % (ref 14.0–49.0)
MCH: 28.8 pg (ref 27.2–33.4)
MCHC: 33.3 g/dL (ref 32.0–36.0)
MCV: 86.6 fL (ref 79.3–98.0)
MONO#: 0.4 10*3/uL (ref 0.1–0.9)
MONO%: 9.5 % (ref 0.0–14.0)
NEUT#: 1.6 10*3/uL (ref 1.5–6.5)
NEUT%: 42.7 % (ref 39.0–75.0)
Platelets: 265 10*3/uL (ref 140–400)
RBC: 4.02 10*6/uL — ABNORMAL LOW (ref 4.20–5.82)
RDW: 14.2 % (ref 11.0–14.6)
WBC: 3.7 10*3/uL — ABNORMAL LOW (ref 4.0–10.3)
lymph#: 1.5 10*3/uL (ref 0.9–3.3)

## 2016-12-31 NOTE — Progress Notes (Addendum)
  [place] OFFICE PROGRESS NOTE   Diagnosis:  Anemia  INTERVAL HISTORY:   Gregory Spence returns as scheduled. He has no complaints. No interim illnesses or infections. He has a good appetite and good energy level. No bleeding. No shortness of breath. No seizure activity.  Objective:  Vital signs in last 24 hours:  Blood pressure (!) 156/75, pulse 63, temperature 97.7 F (36.5 C), temperature source Oral, resp. rate 18, height 6' (1.829 m), weight 146 lb 8 oz (66.5 kg), SpO2 100 %.   HEENT: No thrush or ulcers. Neck without mass. Resp: Lungs clear bilaterally. Cardio: Regular rate and rhythm. GI: Abdomen soft and nontender. No hepatosplenomegaly. Vascular: No leg edema.    Lab Results:  Lab Results  Component Value Date   WBC 3.7 (L) 12/31/2016   HGB 11.6 (L) 12/31/2016   HCT 34.8 (L) 12/31/2016   MCV 86.6 12/31/2016   PLT 265 12/31/2016   NEUTROABS 1.6 12/31/2016    Imaging:  No results found.  Medications: I have reviewed the patient's current medications.  Assessment/Plan: 1. Normocytic anemia-stable 2. History of mild neutropenia 3. Seizure disorder 4. Hypertension   Disposition: Gregory Spence appears stable. He has a chronic mild normocytic anemia and history of mild neutropenia. Labs today are stable. We did not schedule formal follow-up in our office but will be happy to see him in the future if needed.  Patient seen with Dr. Benay Spice.  Ned Card ANP/GNP-BC   12/31/2016  12:44 PM This was a shared visit with Ned Card. Gregory Spence is stable from a hematologic standpoint. He was discharged from the Hematology clinic today. We are available to see him in the future if he develops a progressive hematologic abnormality.  Julieanne Manson, M.D.

## 2017-07-03 ENCOUNTER — Ambulatory Visit: Payer: Medicare Other | Admitting: Nurse Practitioner

## 2017-07-25 NOTE — Progress Notes (Signed)
[facility]  PATIENT: Gregory Spence DOB: 1956-04-01   REASON FOR VISIT: Follow-up for seizure disorder and mild mental retardation HISTORY FROM: Patient and friend    HISTORY OF PRESENT ILLNESS: Gregory Spence, 62 year old male returns for yearly follow-up. He reestablished care here with  [name] 09/25/2013. He had a previous patient of ours with Dr. [name] who has left the practice. 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 Last seizure event occurred September 1990. He also has a congenital left hemiparesis. Denies any daytime drowsiness headaches visual disturbance no falls. Denies missing any doses of his medications. He returns for reevaluation. Mother passed away in 2014/07/11. She was primary caretaker. CBC  followed through oncology for his anemia. He needs liver function testing and refills. He has no new complaints.  He reports appetite is good and he is sleeping well    62 year old male with history of mild mental retardation, static encephalopathy, mild congential left hemiparesis, seizure disorder, here to establish again for seizure disorder management.  Patient takes carbamazepine 200 mg tablets, 2.5 tablets twice a day. Patient has not had a seizure in over 20 years (last seizure 1990?). Otherwise patient is doing well. He continues to work for [employer] [place].  First seizure of life around age 68 years old, with generalized convulsive seizures. No warning sign for seizures. Patient was initially on Dilantin and then transition to carbamazepine. No seizure since starting carbamazepine.   REVIEW OF SYSTEMS: Full 14 system review of systems performed and notable only for those listed, all others are neg:  Constitutional: neg  Cardiovascular: neg Ear/Nose/Throat: neg  Skin: neg Eyes: neg Respiratory: neg Gastroitestinal: neg Hematology/Lymphatic:  neg  Endocrine: neg Musculoskeletal:neg Allergy/Immunology: neg Neurological: History of seizure disorder, MR Psychiatric: neg Sleep : neg   ALLERGIES: No Known Allergies  HOME MEDICATIONS: Outpatient Medications Prior to Visit  Medication Sig Dispense Refill  . atenolol (TENORMIN) 25 MG tablet 25 mg daily.     Marland Kitchen atorvastatin (LIPITOR) 80 MG tablet TAKE 1 TABLET BY MOUTH EVERY DAY NEED APPT FOR MORE REFILLS  0  . carbamazepine (TEGRETOL) 200 MG tablet Take 2.5 tablets (500 mg total) by mouth 2 (two) times daily. 150 tablet 11  . ferrous sulfate 325 (65 FE) MG tablet Take 325 mg by mouth every Monday, Wednesday, and Friday.    . folic acid (FOLVITE) 188 MCG tablet Take 400 mcg by mouth. Takes one tablet on Tues and Sat.     No facility-administered medications prior to visit.     PAST MEDICAL HISTORY: Past Medical History:  Diagnosis Date  . Hypertension   . Seizures ([place])     PAST SURGICAL HISTORY: History reviewed. No pertinent surgical history.  FAMILY HISTORY: Family History  Problem Relation Age of Onset  . Heart disease Mother   . Diabetes Mother   . Other Mother        infection  . Colon cancer Sister   . Colon cancer Brother     SOCIAL HISTORY: Social History   Socioeconomic History  . Marital status: Single    Spouse name: Not on file  . Number of children: 0  . Years of education: 12th  . Highest education level: Not on file  Social Needs  . Financial resource strain: Not on file  . Food insecurity - worry: Not on file  . Food insecurity - inability: Not on file  .  Transportation needs - medical: Not on file  . Transportation needs - non-medical: Not on file  Occupational History    Employer: [employer].  Tobacco Use  . Smoking status: Never Smoker  . Smokeless tobacco: Never Used  Substance and Sexual Activity  . Alcohol use: No  . Drug use: No  . Sexual activity: Not on file  Other Topics Concern  . Not on file  Social  History Narrative   Patient lives at home with father, twin brother.    Caffeine Use: 2 sodas daily   Never used EtOH, tobacco or illicit drugs.   Attended special education classes; graduated from 12th grade.     PHYSICAL EXAM  Vitals:   07/29/17 1501  BP: (!) 145/82  Pulse: 80  Weight: 153 lb (69.4 kg)  Height: 6' (1.829 m)   Body mass index is 20.75 kg/m.  Generalized: Well developed, in no acute distress, well groomed  Head: normocephalic and atraumatic,. Oropharynx benign  Neck: Supple,  Musculoskeletal: No deformity   Neurological examination   Mentation: Alert oriented to time, place, slow to answer questions. Mild MR .Follows all commands,    Cranial nerve II-XII: Pupils were equal round reactive to light extraocular movements were full, visual field were full on confrontational test. Facial sensation and strength were normal. hearing was intact to finger rubbing bilaterally. Uvula tongue midline. head turning and shoulder shrug were normal and symmetric.Tongue protrusion into cheek strength was normal. Motor: normal bulk and tone, full strength in the BUE, BLE, except mild weakness left upper extremity Sensory: normal and symmetric to light touch,  Coordination: finger-nose-finger, heel-to-shin bilaterally, clumsy on the left normal on the right Reflexes: 1+ upper lower and symmetric  Gait and Station: Rising up from seated position without assistance, normal stance,  moderate stride, good arm swing, smooth turning, able to perform tiptoe, and heel walking without difficulty. Tandem gait is steady. Romberg negative  DIAGNOSTIC DATA (LABS, IMAGING, TESTING) - I reviewed patient records, labs, notes, testing and imaging myself where available.  Lab Results  Component Value Date   WBC 3.7 (L) 12/31/2016   HGB 11.6 (L) 12/31/2016   HCT 34.8 (L) 12/31/2016   MCV 86.6 12/31/2016   PLT 265 12/31/2016     ASSESSMENT AND PLAN  62 y.o. year old male  has a past  medical history of Hypertension and Seizures ([place]). here to follow-up for seizure disorder. Last seizure occurred September 1990  PLAN:Continue Tegretol at current dose will refill Will check CMP to monitor adverse effects of Tegretol,  CBC reviewed from 12/31/16   Follow-up yearly and when necessary Call for any seizure activity Dennie Bible, [facility], [facility], [facility] [address], [place], [place] [address] [phone]

## 2017-07-29 ENCOUNTER — Encounter: Payer: Self-pay | Admitting: Nurse Practitioner

## 2017-07-29 ENCOUNTER — Encounter (INDEPENDENT_AMBULATORY_CARE_PROVIDER_SITE_OTHER): Payer: Self-pay

## 2017-07-29 ENCOUNTER — Ambulatory Visit (INDEPENDENT_AMBULATORY_CARE_PROVIDER_SITE_OTHER): Payer: Medicare Other | Admitting: Nurse Practitioner

## 2017-07-29 VITALS — BP 145/82 | HR 80 | Ht 72.0 in | Wt 153.0 lb

## 2017-07-29 DIAGNOSIS — G40909 Epilepsy, unspecified, not intractable, without status epilepticus: Secondary | ICD-10-CM

## 2017-07-29 DIAGNOSIS — Z5181 Encounter for therapeutic drug level monitoring: Secondary | ICD-10-CM | POA: Insufficient documentation

## 2017-07-29 DIAGNOSIS — F70 Mild intellectual disabilities: Secondary | ICD-10-CM

## 2017-07-29 MED ORDER — CARBAMAZEPINE 200 MG PO TABS
500.0000 mg | ORAL_TABLET | Freq: Two times a day (BID) | ORAL | 3 refills | Status: DC
Start: 2017-07-29 — End: 2018-08-11

## 2017-07-29 NOTE — Patient Instructions (Signed)
Continue Tegretol at current dose will refill Will check CMP,  CBC reviewed from 12/31/16   Follow-up yearly and when necessary Call for any seizure activity

## 2017-07-30 ENCOUNTER — Telehealth: Payer: Self-pay

## 2017-07-30 LAB — COMPREHENSIVE METABOLIC PANEL
A/G RATIO: 1.4 (ref 1.2–2.2)
ALBUMIN: 4.4 g/dL (ref 3.6–4.8)
ALT: 28 IU/L (ref 0–44)
AST: 30 IU/L (ref 0–40)
Alkaline Phosphatase: 85 IU/L (ref 39–117)
BUN / CREAT RATIO: 10 (ref 10–24)
BUN: 10 mg/dL (ref 8–27)
CALCIUM: 9.2 mg/dL (ref 8.6–10.2)
CHLORIDE: 101 mmol/L (ref 96–106)
CO2: 27 mmol/L (ref 20–29)
Creatinine, Ser: 1.03 mg/dL (ref 0.76–1.27)
GFR, EST AFRICAN AMERICAN: 90 mL/min/{1.73_m2} (ref >59)
GFR, EST NON AFRICAN AMERICAN: 77 mL/min/{1.73_m2} (ref >59)
GLUCOSE: 95 mg/dL (ref 65–99)
Globulin, Total: 3.1 g/dL (ref 1.5–4.5)
Potassium: 4.4 mmol/L (ref 3.5–5.2)
Sodium: 141 mmol/L (ref 134–144)
TOTAL PROTEIN: 7.5 g/dL (ref 6.0–8.5)

## 2017-07-30 NOTE — Telephone Encounter (Signed)
-----   Message from Dennie Bible, NP sent at 07/30/2017  8:16 AM EST ----- Labs are stable please call the patient/caregiver

## 2017-07-30 NOTE — Progress Notes (Signed)
I reviewed note and agree with plan.   Penni Bombard, MD 09/28/477, 9:87 PM Certified in Neurology, Neurophysiology and Neuroimaging  [facility] [address] [place], [place] [address] [phone]

## 2017-07-30 NOTE — Telephone Encounter (Signed)
Notes recorded by Marval Regal, RN on 07/30/2017 at 4:14 PM EST Rn call patient and his listed number stated call cannot be completed. ------

## 2017-07-31 ENCOUNTER — Telehealth: Payer: Self-pay | Admitting: *Deleted

## 2017-07-31 NOTE — Telephone Encounter (Signed)
Called mobile number and LVM with office number asking patient for call back.

## 2017-07-31 NOTE — Telephone Encounter (Signed)
Spoke to pt and relayed that his labs were stable.  He verbalized understanding.

## 2018-08-04 ENCOUNTER — Ambulatory Visit: Payer: Medicare Other | Admitting: Nurse Practitioner

## 2018-08-11 ENCOUNTER — Ambulatory Visit (INDEPENDENT_AMBULATORY_CARE_PROVIDER_SITE_OTHER): Payer: Medicare Other | Admitting: Diagnostic Neuroimaging

## 2018-08-11 ENCOUNTER — Encounter: Payer: Self-pay | Admitting: Diagnostic Neuroimaging

## 2018-08-11 VITALS — BP 129/70 | HR 64 | Ht 72.0 in | Wt 147.6 lb

## 2018-08-11 DIAGNOSIS — G40909 Epilepsy, unspecified, not intractable, without status epilepticus: Secondary | ICD-10-CM

## 2018-08-11 DIAGNOSIS — Z5181 Encounter for therapeutic drug level monitoring: Secondary | ICD-10-CM

## 2018-08-11 MED ORDER — CARBAMAZEPINE 200 MG PO TABS: 500.0000 mg | ORAL_TABLET | Freq: Two times a day (BID) | ORAL | 3 refills | Status: DC

## 2018-08-11 NOTE — Progress Notes (Signed)
[facility]  PATIENT: Gregory Spence DOB: 1955-12-17  REFERRING CLINICIAN: Blount HISTORY FROM: patient REASON FOR VISIT: follow up   HISTORICAL  CHIEF COMPLAINT:  Chief Complaint  Patient presents with  . Follow-up    9 month follow up. Alone. Rm 7. No new concerns at this time.     HISTORY OF PRESENT ILLNESS:   UPDATE (08/11/18, VRP): Since last visit, doing well. Symptoms are stable. No alleviating or aggravating factors. Tolerating CBZ. No seizures.  UPDATE (07/29/17, CM): 63 year old male returns for yearly follow-up. He reestablished care here with  [name] 09/25/2013. He had a previous patient of ours with Dr. [name] who has left the practice. 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 Last seizure event occurred September 1990. He also has a congenital left hemiparesis. Denies any daytime drowsiness headaches visual disturbance no falls. Denies missing any doses of his medications. He returns for reevaluation. Mother passed away in Jul 16, 2014. She was primary caretaker. CBC  followed through oncology for his anemia. He needs liver function testing and refills. He has no new complaints.  He reports appetite is good.   PRIOR HPI (09/25/13, VRP): 63 year old male with history of mild mental retardation, static encephalopathy, mild congential left hemiparesis, seizure disorder, here to establish again for seizure disorder management.  Patient takes carbamazepine 200 mg tablets, 2.5 tablets twice a day. Patient has not had a seizure in over 20 years (last seizure 1990?). Otherwise patient is doing well. He continues to work for [employer] [employer].  First seizure of life around age 74 years old, with generalized convulsive seizures. No warning sign for seizures. Patient was initially on Dilantin and then transition to carbamazepine. No seizure since starting  carbamazepine.   REVIEW OF SYSTEMS: Full 14 system review of systems performed and negative except: only as per HPI.    ALLERGIES: No Known Allergies  HOME MEDICATIONS: Outpatient Medications Prior to Visit  Medication Sig Dispense Refill  . atenolol (TENORMIN) 25 MG tablet 25 mg daily.     Marland Kitchen atorvastatin (LIPITOR) 80 MG tablet TAKE 1 TABLET BY MOUTH EVERY DAY NEED APPT FOR MORE REFILLS  0  . carbamazepine (TEGRETOL) 200 MG tablet Take 2.5 tablets (500 mg total) by mouth 2 (two) times daily. 450 tablet 3  . ferrous sulfate 325 (65 FE) MG tablet Take 325 mg by mouth every Monday, Wednesday, and Friday.    . folic acid (FOLVITE) 245 MCG tablet Take 400 mcg by mouth. Takes one tablet on Tues and Sat.    . Probiotic Product ([employer] PROBIOTIC) CAPS Take 1 tablet by mouth daily.     No facility-administered medications prior to visit.     PAST MEDICAL HISTORY: Past Medical History:  Diagnosis Date  . Hypertension   . Seizures ([place])     PAST SURGICAL HISTORY: History reviewed. No pertinent surgical history.  FAMILY HISTORY: Family History  Problem Relation Age of Onset  . Heart disease Mother   . Diabetes Mother   . Other Mother        infection  . Colon cancer Sister   . Colon cancer Brother     SOCIAL HISTORY:  Social History   Socioeconomic History  . Marital status: Single    Spouse name: Not on file  . Number of children: 0  . Years of education: 12th  . Highest education level: Not on file  Occupational History  Employer: [employer].  Social Needs  . Financial resource strain: Not on file  . Food insecurity:    Worry: Not on file    Inability: Not on file  . Transportation needs:    Medical: Not on file    Non-medical: Not on file  Tobacco Use  . Smoking status: Never Smoker  . Smokeless tobacco: Never Used  Substance and Sexual Activity  . Alcohol use: No  . Drug use: No  . Sexual activity: Not on file  Lifestyle  . Physical  activity:    Days per week: Not on file    Minutes per session: Not on file  . Stress: Not on file  Relationships  . Social connections:    Talks on phone: Not on file    Gets together: Not on file    Attends religious service: Not on file    Active member of club or organization: Not on file    Attends meetings of clubs or organizations: Not on file    Relationship status: Not on file  . Intimate partner violence:    Fear of current or ex partner: Not on file    Emotionally abused: Not on file    Physically abused: Not on file    Forced sexual activity: Not on file  Other Topics Concern  . Not on file  Social History Narrative   Patient lives at home with father, twin brother.    Caffeine Use: 2 sodas daily   Never used EtOH, tobacco or illicit drugs.   Attended special education classes; graduated from 12th grade.     PHYSICAL EXAM  Vitals:   08/11/18 1350  BP: 129/70  Pulse: 64  Weight: 147 lb 9.6 oz (67 kg)  Height: 6' (1.829 m)    Not recorded      Body mass index is 20.02 kg/m.  GENERAL EXAM: Patient is in no distress; well developed, nourished and groomed; neck is supple  CARDIOVASCULAR: Regular rate and rhythm, no murmurs, no carotid bruits  NEUROLOGIC: MENTAL STATUS: awake, alert, SLOW RESPONSES, MILD MR, PLEASANT AND APPROPRIATE.  CRANIAL NERVE: pupils equal and reactive to light, visual fields full to confrontation, extraocular muscles intact, no nystagmus, facial sensation and strength symmetric, hearing intact, palate elevates symmetrically, uvula midline, shoulder shrug symmetric, tongue midline. MOTOR: normal bulk and tone, full strength in the BUE, BLE SENSORY: normal and symmetric to light touch COORDINATION: finger-nose-finger, fine finger movements --> normal REFLEXES: deep tendon reflexes present and symmetric GAIT/STATION: narrow based gait; romberg is negative     DIAGNOSTIC DATA (LABS, IMAGING, TESTING) - I reviewed patient records,  labs, notes, testing and imaging myself where available.  Lab Results  Component Value Date   WBC 3.7 (L) 12/31/2016      Component Value Date/Time   NA 141 07/29/2017 1532   NA 140 12/17/2013 1026   No results found for: CHOL No results found for: HGBA1C Lab Results  Component Value Date   [id number] 235 12/17/2013   No results found for: TSH  ASSESSMENT AND PLAN  63 y.o. year old male here with mild mental retardation, static encephalopathy, mild congential left hemiparesis, seizure disorder. Doing well on CBZ 500mg  BID (=200mg  tabs --> 2.5 tabs BID). No seizures since 1990. Would recommend continuing current dose. Needs annual CBC, LFTs per PCP  PLAN:  SEIZURE DISORDER - continue carbamazepine 500mg  twice a day (200mg  tabs --> 2.5 tabs twice a day) - annual CBC, CMP per PCP  Meds ordered this encounter  Medications  . carbamazepine (TEGRETOL) 200 MG tablet    Sig: Take 2.5 tablets (500 mg total) by mouth 2 (two) times daily.    Dispense:  450 tablet    Refill:  3   Return in about 1 year (around 08/12/2019) for with NP (Amy Lomax).   Penni Bombard, MD 1/60/7371, 0:62 PM Certified in Neurology, Neurophysiology and Neuroimaging  [facility] [address] [place], [place] [address] [phone]

## 2018-08-11 NOTE — Patient Instructions (Signed)
-  continue current medications

## 2019-08-17 ENCOUNTER — Ambulatory Visit: Payer: Medicare Other | Admitting: Family Medicine

## 2019-08-22 ENCOUNTER — Other Ambulatory Visit: Payer: Self-pay | Admitting: Diagnostic Neuroimaging

## 2019-10-01 ENCOUNTER — Ambulatory Visit (INDEPENDENT_AMBULATORY_CARE_PROVIDER_SITE_OTHER): Payer: Medicare Other | Admitting: Family Medicine

## 2019-10-01 ENCOUNTER — Other Ambulatory Visit: Payer: Self-pay

## 2019-10-01 ENCOUNTER — Encounter: Payer: Self-pay | Admitting: Family Medicine

## 2019-10-01 VITALS — BP 140/72 | HR 72 | Temp 97.6°F | Ht 75.0 in | Wt 158.0 lb

## 2019-10-01 DIAGNOSIS — G40909 Epilepsy, unspecified, not intractable, without status epilepticus: Secondary | ICD-10-CM

## 2019-10-01 MED ORDER — CARBAMAZEPINE 200 MG PO TABS: 500.0000 mg | ORAL_TABLET | Freq: Two times a day (BID) | ORAL | 3 refills | Status: DC

## 2019-10-01 NOTE — Patient Instructions (Addendum)
Continue carbamazepine 2.5 tablets (500mg ) twice daily. Stay well hydrated and healthy lifestyle habits encouraged.   Follow up in 1 year, sooner if needed  Seizure, Adult A seizure is a sudden burst of abnormal electrical activity in the brain. Seizures usually last from 30 seconds to 2 minutes. They can cause many different symptoms. Usually, seizures are not harmful unless they last a long time. What are the causes? Common causes of this condition include:  Fever or infection.  Conditions that affect the brain, such as: ? A brain abnormality that you were born with. ? A brain or head injury. ? Bleeding in the brain. ? A tumor. ? Stroke. ? Brain disorders such as autism or cerebral palsy.  Low blood sugar.  Conditions that are passed from parent to child (are inherited).  Problems with substances, such as: ? Having a reaction to a drug or a medicine. ? Suddenly stopping the use of a substance (withdrawal). In some cases, the cause may not be known. A person who has repeated seizures over time without a clear cause has a condition called epilepsy. What increases the risk? You are more likely to get this condition if you have:  A family history of epilepsy.  Had a seizure in the past.  A brain disorder.  A history of head injury, lack of oxygen at birth, or strokes. What are the signs or symptoms? There are many types of seizures. The symptoms vary depending on the type of seizure you have. Examples of symptoms during a seizure include:  Shaking (convulsions).  Stiffness in the body.  Passing out (losing consciousness).  Head nodding.  Staring.  Not responding to sound or touch.  Loss of bladder control and bowel control. Some people have symptoms right before and right after a seizure happens. Symptoms before a seizure may include:  Fear.  Worry (anxiety).  Feeling like you may vomit (nauseous).  Feeling like the room is spinning (vertigo).  Feeling  like you saw or heard something before (dj vu).  Odd tastes or smells.  Changes in how you see. You may see flashing lights or spots. Symptoms after a seizure happens can include:  Confusion.  Sleepiness.  Headache.  Weakness on one side of the body. How is this treated? Most seizures will stop on their own in under 5 minutes. In these cases, no treatment is needed. Seizures that last longer than 5 minutes will usually need treatment. Treatment can include:  Medicines given through an IV tube.  Avoiding things that are known to cause your seizures. These can include medicines that you take for another condition.  Medicines to treat epilepsy.  Surgery to stop the seizures. This may be needed if medicines do not help. Follow these instructions at home: Medicines  Take over-the-counter and prescription medicines only as told by your doctor.  Do not eat or drink anything that may keep your medicine from working, such as alcohol. Activity  Do not do any activities that would be dangerous if you had another seizure, like driving or swimming. Wait until your doctor says it is safe for you to do them.  If you live in the [place] ask your local DMV (department of motor vehicles) when you can drive.  Get plenty of rest. Teaching others Teach friends and family what to do when you have a seizure. They should:  Lay you on the ground.  Protect your head and body.  Loosen any tight clothing around your neck.  Turn you  on your side.  Not hold you down.  Not put anything into your mouth.  Know whether or not you need emergency care.  Stay with you until you are better.  General instructions  Contact your doctor each time you have a seizure.  Avoid anything that gives you seizures.  Keep a seizure diary. Write down: ? What you think caused each seizure. ? What you remember about each seizure.  Keep all follow-up visits as told by your doctor. This is  important. Contact a doctor if:  You have another seizure.  You have seizures more often.  There is any change in what happens during your seizures.  You keep having seizures with treatment.  You have symptoms of being sick or having an infection. Get help right away if:  You have a seizure that: ? Lasts longer than 5 minutes. ? Is different than seizures you had before. ? Makes it harder to breathe. ? Happens after you hurt your head.  You have any of these symptoms after a seizure: ? Not being able to speak. ? Not being able to use a part of your body. ? Confusion. ? A bad headache.  You have two or more seizures in a row.  You do not wake up right after a seizure.  You get hurt during a seizure. These symptoms may be an emergency. Do not wait to see if the symptoms will go away. Get medical help right away. Call your local emergency services (911 in the [place] Do not drive yourself to the hospital. Summary  Seizures usually last from 30 seconds to 2 minutes. Usually, they are not harmful unless they last a long time.  Do not eat or drink anything that may keep your medicine from working, such as alcohol.  Teach friends and family what to do when you have a seizure.  Contact your doctor each time you have a seizure. This information is not intended to replace advice given to you by your health care provider. Make sure you discuss any questions you have with your health care provider. Document Revised: 08/29/2018 Document Reviewed: 08/29/2018 Elsevier Patient Education  [place].

## 2019-10-01 NOTE — Progress Notes (Signed)
I reviewed note and agree with plan.   Penni Bombard, MD [id number], [id number] Certified in Neurology, Neurophysiology and Neuroimaging  [facility] [address] [place], [place] [address] [phone]

## 2019-10-01 NOTE — Progress Notes (Signed)
PATIENT: Gregory Spence DOB: 21-Aug-1955  REASON FOR VISIT: follow up HISTORY FROM: patient  Chief Complaint  Patient presents with  . Follow-up    pt alone, rm 6. pt states things are well and he has no issues or concerns. states no recent SZ     HISTORY OF PRESENT ILLNESS: Today 10/01/19 Gregory Spence is a 64 y.o. male here today for follow up for seizures. He continues carbamazepine 500mg  twice daily. He is tolerating medication well with no obvious adverse effects. He denies seizure activity. He is seen yearly by PCP. No longer seeing hematology. He has received his first of two vaccines and anticipates getting the second on 4/19. He is living with is sister. He is feeling well today.    HISTORY: (copied from [name] note on 08/11/2018)  UPDATE (08/11/18, VRP): Since last visit, doing well. Symptoms are stable. No alleviating or aggravating factors. Tolerating CBZ. No seizures.  UPDATE (07/29/17, CM): 64 year old male returns for yearly follow-up. He reestablished care here with [name] 09/25/2013. He had a previous patient of ours with Dr. [name] who has left the practice. 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 Last seizure event occurred September 1990. He also has a congenital left hemiparesis. Denies any daytime drowsiness headaches visual disturbance no falls. Denies missing any doses of his medications. He returns for reevaluation. Mother passed away in 07-03-2014. She was primary caretaker. CBC followed through oncology for his anemia. He needs liver function testing and refills. He has no new complaints.He reports appetite is good.   PRIOR HPI (09/25/13, VRP): 64 year old male with history of mild mental retardation, static encephalopathy, mild congential left hemiparesis, seizure disorder, here to establish again for seizure disorder management.  Patient  takes carbamazepine 200 mg tablets, 2.5 tablets twice a day. Patient has not had a seizure in over 20 years (last seizure 1990?). Otherwise patient is doing well. He continues to work for [employer].  First seizure of life around age 73 years old, with generalized convulsive seizures. No warning sign for seizures. Patient was initially on Dilantin and then transition to carbamazepine. No seizure since starting carbamazepine.   REVIEW OF SYSTEMS: Out of a complete 14 system review of symptoms, the patient complains only of the following symptoms, none and all other reviewed systems are negative.  ALLERGIES: No Known Allergies  HOME MEDICATIONS: Outpatient Medications Prior to Visit  Medication Sig Dispense Refill  . atenolol (TENORMIN) 25 MG tablet 25 mg daily.     Marland Kitchen atorvastatin (LIPITOR) 80 MG tablet TAKE 1 TABLET BY MOUTH EVERY DAY NEED APPT FOR MORE REFILLS  0  . ferrous sulfate 325 (65 FE) MG tablet Take 325 mg by mouth every Monday, Wednesday, and Friday.    . folic acid (FOLVITE) [id number] MCG tablet Take 400 mcg by mouth. Takes one tablet on Tues and Sat.    . Probiotic Product ([employer]S PROBIOTIC) CAPS Take 1 tablet by mouth daily.    . carbamazepine (TEGRETOL) 200 MG tablet TAKE 2.5 TABLETS (500 MG TOTAL) BY MOUTH 2 (TWO) TIMES DAILY. 450 tablet 0   No facility-administered medications prior to visit.    PAST MEDICAL HISTORY: Past Medical History:  Diagnosis Date  . Hypertension   . Seizures ([place])     PAST SURGICAL HISTORY: History reviewed. No pertinent surgical history.  FAMILY HISTORY: Family History  Problem Relation Age of Onset  .  Heart disease Mother   . Diabetes Mother   . Other Mother        infection  . Colon cancer Sister   . Colon cancer Brother     SOCIAL HISTORY: Social History   Socioeconomic History  . Marital status: Single    Spouse name: Not on file  . Number of children: 0  . Years of education: 12th  . Highest education level: Not on  file  Occupational History    Employer: [employer].  Tobacco Use  . Smoking status: Never Smoker  . Smokeless tobacco: Never Used  Substance and Sexual Activity  . Alcohol use: No  . Drug use: No  . Sexual activity: Not on file  Other Topics Concern  . Not on file  Social History Narrative   Patient lives at home with father, twin brother.    Caffeine Use: 2 sodas daily   Never used EtOH, tobacco or illicit drugs.   Attended special education classes; graduated from 12th grade.   Social Determinants of Health   Financial Resource Strain:   . Difficulty of Paying Living Expenses:   Food Insecurity:   . Worried About Charity fundraiser in the Last Year:   . Arboriculturist in the Last Year:   Transportation Needs:   . Film/video editor (Medical):   Marland Kitchen Lack of Transportation (Non-Medical):   Physical Activity:   . Days of Exercise per Week:   . Minutes of Exercise per Session:   Stress:   . Feeling of Stress :   Social Connections:   . Frequency of Communication with Friends and Family:   . Frequency of Social Gatherings with Friends and Family:   . Attends Religious Services:   . Active Member of Clubs or Organizations:   . Attends Archivist Meetings:   Marland Kitchen Marital Status:   Intimate Partner Violence:   . Fear of Current or Ex-Partner:   . Emotionally Abused:   Marland Kitchen Physically Abused:   . Sexually Abused:       PHYSICAL EXAM  Vitals:   10/01/19 0750  BP: 140/72  Pulse: 72  Temp: 97.6 F (36.4 C)  Weight: 158 lb (71.7 kg)  Height: 6\' 3"  (1.905 m)   Body mass index is 19.75 kg/m.  Generalized: Well developed, in no acute distress  Cardiology: normal rate and rhythm, no murmur noted Respiratory: clear to auscultation bilaterally  Neurological examination  Mentation: Alert, slow responses, Mild MR. Follows all commands speech and language fluent Cranial nerve II-XII: Pupils were equal round reactive to light. Extraocular movements  were full, visual field were full on confrontational test. Facial sensation and strength were normal. Uvula tongue midline. Head turning and shoulder shrug  were normal and symmetric. Motor: The motor testing reveals 5 over 5 strength of all 4 extremities. Good symmetric motor tone is noted throughout.  Sensory: Sensory testing is intact to soft touch on all 4 extremities. No evidence of extinction is noted.  Coordination: Cerebellar testing reveals good finger-nose-finger and heel-to-shin bilaterally.  Gait and station: Gait is narrow but stable.   DIAGNOSTIC DATA (LABS, IMAGING, TESTING) - I reviewed patient records, labs, notes, testing and imaging myself where available.  No flowsheet data found.   Lab Results  Component Value Date   WBC 3.7 (L) 12/31/2016   HGB 11.6 (L) 12/31/2016   HCT 34.8 (L) 12/31/2016   MCV 86.6 12/31/2016   PLT 265 12/31/2016  Component Value Date/Time   NA 141 07/29/2017 1532   NA 140 12/17/2013 1026   K 4.4 07/29/2017 1532   K 4.1 12/17/2013 1026   CL 101 07/29/2017 1532   CO2 27 07/29/2017 1532   CO2 27 12/17/2013 1026   GLUCOSE 95 07/29/2017 1532   GLUCOSE 91 12/17/2013 1026   BUN 10 07/29/2017 1532   BUN 6.8 (L) 12/17/2013 1026   CREATININE 1.03 07/29/2017 1532   CREATININE 1.0 12/17/2013 1026   CALCIUM 9.2 07/29/2017 1532   CALCIUM 9.3 12/17/2013 1026   PROT 7.5 07/29/2017 1532   PROT 7.6 12/17/2013 1026   ALBUMIN 4.4 07/29/2017 1532   ALBUMIN 3.9 12/17/2013 1026   AST 30 07/29/2017 1532   AST 25 12/17/2013 1026   ALT 28 07/29/2017 1532   ALT 20 12/17/2013 1026   ALKPHOS 85 07/29/2017 1532   ALKPHOS 83 12/17/2013 1026   BILITOT <0.2 07/29/2017 1532   BILITOT 0.26 12/17/2013 1026   GFRNONAA 77 07/29/2017 1532   GFRAA 90 07/29/2017 1532   No results found for: CHOL, HDL, LDLCALC, LDLDIRECT, TRIG, CHOLHDL No results found for: HGBA1C Lab Results  Component Value Date   [id number] 12/17/2013   No results found for:  TSH     ASSESSMENT AND PLAN 64 y.o. year old male  has a past medical history of Hypertension and Seizures ([place]). here with     ICD-10-CM   1. Seizure disorder ([place])  G40.909 CBC with Differential/Platelets    CMP    Jermiah is doing well. He is tolerating carbamazepine 500mg  twice daily with no adverse effects. We will continue current treatment. Will update labs today. He was encouraged to stay well hydrated and work on healthy lifestyle habits with well balanced diet and regular exercise. He will follow up with me in 1 year, sooner if needed.    Orders Placed This Encounter  Procedures  . CBC with Differential/Platelets  . CMP     Meds ordered this encounter  Medications  . carbamazepine (TEGRETOL) 200 MG tablet    Sig: Take 2.5 tablets (500 mg total) by mouth 2 (two) times daily.    Dispense:  450 tablet    Refill:  3    Order Specific Question:   Supervising Provider    Answer:   Melvenia Beam [id number]      I spent 15 minutes with the patient. 50% of this time was spent counseling and educating patient on plan of care and medications.    Debbora Presto, FNP-C 10/01/2019, 8:03 AM [facility] [address] [place], [place] [address] [phone]

## 2019-10-02 LAB — CBC WITH DIFFERENTIAL/PLATELET
Basophils Absolute: 0 10*3/uL (ref 0.0–0.2)
Basos: 1 %
EOS (ABSOLUTE): 0.2 10*3/uL (ref 0.0–0.4)
Eos: 6 %
Hematocrit: 36.7 % — ABNORMAL LOW (ref 37.5–51.0)
Hemoglobin: 12.1 g/dL — ABNORMAL LOW (ref 13.0–17.7)
Immature Grans (Abs): 0 10*3/uL (ref 0.0–0.1)
Immature Granulocytes: 0 %
Lymphocytes Absolute: 1.2 10*3/uL (ref 0.7–3.1)
Lymphs: 38 %
MCH: 29.4 pg (ref 26.6–33.0)
MCHC: 33 g/dL (ref 31.5–35.7)
MCV: 89 fL (ref 79–97)
Monocytes Absolute: 0.4 10*3/uL (ref 0.1–0.9)
Monocytes: 12 %
Neutrophils Absolute: 1.3 10*3/uL — ABNORMAL LOW (ref 1.4–7.0)
Neutrophils: 43 %
Platelets: 329 10*3/uL (ref 150–450)
RBC: 4.12 x10E6/uL — ABNORMAL LOW (ref 4.14–5.80)
RDW: 12.7 % (ref 11.6–15.4)
WBC: 3 10*3/uL — ABNORMAL LOW (ref 3.4–10.8)

## 2019-10-02 LAB — COMPREHENSIVE METABOLIC PANEL
ALT: 21 IU/L (ref 0–44)
AST: 26 IU/L (ref 0–40)
Albumin/Globulin Ratio: 1.4 (ref 1.2–2.2)
Albumin: 4.3 g/dL (ref 3.8–4.8)
Alkaline Phosphatase: 87 IU/L (ref 39–117)
BUN/Creatinine Ratio: 7 — ABNORMAL LOW (ref 10–24)
BUN: 7 mg/dL — ABNORMAL LOW (ref 8–27)
Bilirubin Total: <0.2 mg/dL (ref 0.0–1.2)
CO2: 25 mmol/L (ref 20–29)
Calcium: 9.3 mg/dL (ref 8.6–10.2)
Chloride: 101 mmol/L (ref 96–106)
Creatinine, Ser: 0.96 mg/dL (ref 0.76–1.27)
GFR calc Af Amer: 96 mL/min/{1.73_m2} (ref >59)
GFR calc non Af Amer: 83 mL/min/{1.73_m2} (ref >59)
Globulin, Total: 3 g/dL (ref 1.5–4.5)
Glucose: 97 mg/dL (ref 65–99)
Potassium: 4.6 mmol/L (ref 3.5–5.2)
Sodium: 137 mmol/L (ref 134–144)
Total Protein: 7.3 g/dL (ref 6.0–8.5)

## 2019-10-05 ENCOUNTER — Telehealth: Payer: Self-pay

## 2019-10-05 NOTE — Telephone Encounter (Signed)
Pt verified by name and DOB,   results given per provider, pt voiced understanding all question answered.  Labs faxed to PCP

## 2020-09-28 NOTE — Patient Instructions (Incomplete)
Below is our plan:  We will   Please make sure you are staying well hydrated. I recommend 50-60 ounces daily. Well balanced diet and regular exercise encouraged. Consistent sleep schedule with 6-8 hours recommended.   Please continue follow up with care team as directed.   Follow up   You may receive a survey regarding today's visit. I encourage you to leave honest feed back as I do use this information to improve patient care. Thank you for seeing me today!      Seizure, Adult A seizure is a sudden burst of abnormal electrical and chemical activity in the brain. Seizures usually last from 30 seconds to 2 minutes.  What are the causes? Common causes of this condition include:  Fever or infection.  Problems that affect the brain. These may include: ? A brain or head injury. ? Bleeding in the brain. ? A brain tumor.  Low levels of blood sugar or salt.  Kidney problems or liver problems.  Conditions that are passed from parent to child (are inherited).  Problems with a substance, such as: ? Having a reaction to a drug or a medicine. ? Stopping the use of a substance all of a sudden (withdrawal).  A stroke.  Disorders that affect how you develop. Sometimes, the cause may not be known.  What increases the risk?  Having someone in your family who has epilepsy. In this condition, seizures happen again and again over time. They have no clear cause.  Having had a tonic-clonic seizure before. This type of seizure causes you to: ? Tighten the muscles of the whole body. ? Lose consciousness.  Having had a head injury or strokes before.  Having had a lack of oxygen at birth. What are the signs or symptoms? There are many types of seizures. The symptoms vary depending on the type of seizure you have. Symptoms during a seizure  Shaking that you cannot control (convulsions) with fast, jerky movements of muscles.  Stiffness of the body.  Breathing problems.  Feeling  mixed up (confused).  Staring or not responding to sound or touch.  Head nodding.  Eyes that blink, flutter, or move fast.  Drooling, grunting, or making clicking sounds with your mouth  Losing control of when you pee or poop. Symptoms before a seizure  Feeling afraid, nervous, or worried.  Feeling like you may vomit.  Feeling like: ? You are moving when you are not. ? Things around you are moving when they are not.  Feeling like you saw or heard something before (dj vu).  Odd tastes or smells.  Changes in how you see. You may see flashing lights or spots. Symptoms after a seizure  Feeling confused.  Feeling sleepy.  Headache.  Sore muscles. How is this treated? If your seizure stops on its own, you will not need treatment. If your seizure lasts longer than 5 minutes, you will normally need treatment. Treatment may include:  Medicines given through an IV tube.  Avoiding things, such as medicines, that are known to cause your seizures.  Medicines to prevent seizures.  A device to prevent or control seizures.  Surgery.  A diet low in carbohydrates and high in fat (ketogenic diet). Follow these instructions at home: Medicines  Take over-the-counter and prescription medicines only as told by your doctor.  Avoid foods or drinks that may keep your medicine from working, such as alcohol. Activity  Follow instructions about driving, swimming, or doing things that would be dangerous if you  had another seizure. Wait until your doctor says it is safe for you to do these things.  If you live in the [place] ask your local department of motor vehicles when you can drive.  Get a lot of rest. Teaching others  Teach friends and family what to do when you have a seizure. They should: ? Help you get down to the ground. ? Protect your head and body. ? Loosen any clothing around your neck. ? Turn you on your side. ? Know whether or not you need emergency care. ? Stay  with you until you are better.  Also, tell them what not to do if you have a seizure. Tell them: ? They should not hold you down. ? They should not put anything in your mouth.   General instructions  Avoid anything that gives you seizures.  Keep a seizure diary. Write down: ? What you remember about each seizure. ? What you think caused each seizure.  Keep all follow-up visits. Contact a doctor if:  You have another seizure or seizures. Call the doctor each time you have a seizure.  The pattern of your seizures changes.  You keep having seizures with treatment.  You have symptoms of being sick or having an infection.  You are not able to take your medicine. Get help right away if:  You have any of these problems: ? A seizure that lasts longer than 5 minutes. ? Many seizures in a row and you do not feel better between seizures. ? A seizure that makes it harder to breathe. ? A seizure and you can no longer speak or use part of your body.  You do not wake up right after a seizure.  You get hurt during a seizure.  You feel confused or have pain right after a seizure. These symptoms may be an emergency. Get help right away. Call your local emergency services (911 in the [place]  Do not wait to see if the symptoms will go away.  Do not drive yourself to the hospital. Summary  A seizure is a sudden burst of abnormal electrical and chemical activity in the brain. Seizures normally last from 30 seconds to 2 minutes.  Causes of seizures include illness, injury to the head, low levels of blood sugar or salt, and certain conditions.  Most seizures will stop on their own in less than 5 minutes. Seizures that last longer than 5 minutes are a medical emergency and need treatment right away.  Many medicines are used to treat seizures. Take over-the-counter and prescription medicines only as told by your doctor. This information is not intended to replace advice given to you by your  health care provider. Make sure you discuss any questions you have with your health care provider. Document Revised: 12/18/2019 Document Reviewed: 12/18/2019 Elsevier Patient Education  [place].

## 2020-09-28 NOTE — Progress Notes (Deleted)
PATIENT: Gregory Spence DOB: 12/14/1955  REASON FOR VISIT: follow up HISTORY FROM: patient  No chief complaint on file.    HISTORY OF PRESENT ILLNESS: 09/28/20 ALL: He returns for follow up for seizures managed with carbamazepine 500mg  BID.    10/01/2019 ALL:  Gregory Spence is a 65 y.o. male here today for follow up for seizures. He continues carbamazepine 500mg  twice daily. He is tolerating medication well with no obvious adverse effects. He denies seizure activity. He is seen yearly by PCP. No longer seeing hematology. He has received his first of two vaccines and anticipates getting the second on 4/19. He is living with is sister. He is feeling well today.    HISTORY: (copied from [name] note on 08/11/2018)  UPDATE (08/11/18, VRP): Since last visit, doing well. Symptoms are stable. No alleviating or aggravating factors. Tolerating CBZ. No seizures.  UPDATE (07/29/17, CM): 65 year old male returns for yearly follow-up. He reestablished care here with [name] 09/25/2013. He had a previous patient of ours with Dr. [name] who has left the practice. 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 Last seizure event occurred September 1990. He also has a congenital left hemiparesis. Denies any daytime drowsiness headaches visual disturbance no falls. Denies missing any doses of his medications. He returns for reevaluation. Mother passed away in 06/24/14. She was primary caretaker. CBC followed through oncology for his anemia. He needs liver function testing and refills. He has no new complaints.He reports appetite is good.   PRIOR HPI (09/25/13, VRP): 65 year old male with history of mild mental retardation, static encephalopathy, mild congential left hemiparesis, seizure disorder, here to establish again for seizure disorder management.  Patient takes carbamazepine 200 mg  tablets, 2.5 tablets twice a day. Patient has not had a seizure in over 20 years (last seizure 1990?). Otherwise patient is doing well. 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  First seizure of life around age 73 years old, with generalized convulsive seizures. No warning sign for seizures. Patient was initially on Dilantin and then transition to carbamazepine. No seizure since starting carbamazepine.   REVIEW OF SYSTEMS: Out of a complete 14 system review of symptoms, the patient complains only of the following symptoms, none and all other reviewed systems are negative.  ALLERGIES: No Known Allergies  HOME MEDICATIONS: Outpatient Medications Prior to Visit  Medication Sig Dispense Refill  . atenolol (TENORMIN) 25 MG tablet 25 mg daily.     Marland Kitchen atorvastatin (LIPITOR) 80 MG tablet TAKE 1 TABLET BY MOUTH EVERY DAY NEED APPT FOR MORE REFILLS  0  . carbamazepine (TEGRETOL) 200 MG tablet Take 2.5 tablets (500 mg total) by mouth 2 (two) times daily. 450 tablet 3  . ferrous sulfate 325 (65 FE) MG tablet Take 325 mg by mouth every Monday, Wednesday, and Friday.    . folic acid (FOLVITE) 725 MCG tablet Take 400 mcg by mouth. Takes one tablet on Tues and Sat.    . Probiotic Product ([employer] PROBIOTIC) CAPS Take 1 tablet by mouth daily.     No facility-administered medications prior to visit.    PAST MEDICAL HISTORY: Past Medical History:  Diagnosis Date  . Hypertension   . Seizures ([place])     PAST SURGICAL HISTORY: No past surgical history on file.  FAMILY HISTORY: Family History  Problem Relation Age of Onset  . Heart disease Mother   . Diabetes Mother   .  Other Mother        infection  . Colon cancer Sister   . Colon cancer Brother     SOCIAL HISTORY: Social History   Socioeconomic History  . Marital status: Single    Spouse name: Not on file  . Number of children: 0  . Years of education: 12th  . Highest education level: Not on file  Occupational History    Employer:  [employer].  Tobacco Use  . Smoking status: Never Smoker  . Smokeless tobacco: Never Used  Substance and Sexual Activity  . Alcohol use: No  . Drug use: No  . Sexual activity: Not on file  Other Topics Concern  . Not on file  Social History Narrative   Patient lives at home with father, twin brother.    Caffeine Use: 2 sodas daily   Never used EtOH, tobacco or illicit drugs.   Attended special education classes; graduated from 12th grade.   Social Determinants of Health   Financial Resource Strain: Not on file  Food Insecurity: Not on file  Transportation Needs: Not on file  Physical Activity: Not on file  Stress: Not on file  Social Connections: Not on file  Intimate Partner Violence: Not on file      PHYSICAL EXAM  There were no vitals filed for this visit. There is no height or weight on file to calculate BMI.  Generalized: Well developed, in no acute distress  Cardiology: normal rate and rhythm, no murmur noted Respiratory: clear to auscultation bilaterally  Neurological examination  Mentation: Alert, slow responses, Mild MR. Follows all commands speech and language fluent Cranial nerve II-XII: Pupils were equal round reactive to light. Extraocular movements were full, visual field were full on confrontational test. Facial sensation and strength were normal. Uvula tongue midline. Head turning and shoulder shrug  were normal and symmetric. Motor: The motor testing reveals 5 over 5 strength of all 4 extremities. Good symmetric motor tone is noted throughout.  Sensory: Sensory testing is intact to soft touch on all 4 extremities. No evidence of extinction is noted.  Coordination: Cerebellar testing reveals good finger-nose-finger and heel-to-shin bilaterally.  Gait and station: Gait is narrow but stable.   DIAGNOSTIC DATA (LABS, IMAGING, TESTING) - I reviewed patient records, labs, notes, testing and imaging myself where available.  No flowsheet data  found.   Lab Results  Component Value Date   WBC 3.0 (L) 10/01/2019   HGB 12.1 (L) 10/01/2019   HCT 36.7 (L) 10/01/2019   MCV 89 10/01/2019   PLT 329 10/01/2019      Component Value Date/Time   NA 137 10/01/2019 0814   NA 140 12/17/2013 1026   K 4.6 10/01/2019 0814   K 4.1 12/17/2013 1026   CL 101 10/01/2019 0814   CO2 25 10/01/2019 0814   CO2 27 12/17/2013 1026   GLUCOSE 97 10/01/2019 0814   GLUCOSE 91 12/17/2013 1026   BUN 7 (L) 10/01/2019 0814   BUN 6.8 (L) 12/17/2013 1026   CREATININE 0.96 10/01/2019 0814   CREATININE 1.0 12/17/2013 1026   CALCIUM 9.3 10/01/2019 0814   CALCIUM 9.3 12/17/2013 1026   PROT 7.3 10/01/2019 0814   PROT 7.6 12/17/2013 1026   ALBUMIN 4.3 10/01/2019 0814   ALBUMIN 3.9 12/17/2013 1026   AST 26 10/01/2019 0814   AST 25 12/17/2013 1026   ALT 21 10/01/2019 0814   ALT 20 12/17/2013 1026   ALKPHOS 87 10/01/2019 0814   ALKPHOS 83 12/17/2013  1026   BILITOT <0.2 10/01/2019 0814   BILITOT 0.26 12/17/2013 1026   GFRNONAA 83 10/01/2019 0814   GFRAA 96 10/01/2019 0814   No results found for: CHOL, HDL, LDLCALC, LDLDIRECT, TRIG, CHOLHDL No results found for: HGBA1C Lab Results  Component Value Date   [id number] 656 12/17/2013   No results found for: TSH     ASSESSMENT AND PLAN 65 y.o. year old male  has a past medical history of Hypertension and Seizures ([place]). here with   No diagnosis found.   Jatavion is doing well. He is tolerating carbamazepine 500mg  twice daily with no adverse effects. We will continue current treatment. Will update labs today. He was encouraged to stay well hydrated and work on healthy lifestyle habits with well balanced diet and regular exercise. He will follow up with me in 1 year, sooner if needed.    No orders of the defined types were placed in this encounter.    No orders of the defined types were placed in this encounter.     I spent 15 minutes with the patient. 50% of this time was spent counseling and  educating patient on plan of care and medications.    Debbora Presto, FNP-C 09/28/2020, 4:47 PM [facility] [address], [place], [place] [address] [phone]

## 2020-09-29 ENCOUNTER — Encounter: Payer: Self-pay | Admitting: Family Medicine

## 2020-09-29 ENCOUNTER — Ambulatory Visit: Payer: Medicare Other | Admitting: Family Medicine

## 2020-12-22 NOTE — Progress Notes (Deleted)
PATIENT: Gregory Spence DOB: 26-Jun-1955  REASON FOR VISIT: follow up HISTORY FROM: patient  No chief complaint on file.    HISTORY OF PRESENT ILLNESS: Today 12/22/20 Gregory Spence is a 65 y.o. male here today for follow up for seizures. He continues carbamazepine 500mg  twice daily. He is tolerating medication well with no obvious adverse effects. He denies seizure activity. He is seen yearly by PCP. No longer seeing hematology. He has received his first of two vaccines and anticipates getting the second on 4/19. He is living with is sister. He is feeling well today.    HISTORY: (copied from [name] note on 08/11/2018)  UPDATE (08/11/18, VRP): Since last visit, doing well. Symptoms are stable. No alleviating or aggravating factors. Tolerating CBZ. No seizures.   UPDATE (07/29/17, CM): 65 year old male returns for yearly follow-up. He reestablished care here with  [name] 09/25/2013. He had a previous patient of ours with Dr. [name] who has left the practice. 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 Last seizure event occurred September 1990. He also has a congenital left hemiparesis. Denies any daytime drowsiness headaches visual disturbance no falls. Denies missing any doses of his medications. He returns for reevaluation. Mother passed away in 15-Jun-2014. She was primary caretaker. CBC  followed through oncology for his anemia. He needs liver function testing and refills. He has no new complaints.  He reports appetite is good.    PRIOR HPI (09/25/13, VRP): 65 year old male with history of mild mental retardation, static encephalopathy, mild congential left hemiparesis, seizure disorder, here to establish again for seizure disorder management.   Patient takes carbamazepine 200 mg tablets, 2.5 tablets twice a day. Patient has not had a seizure in over 20 years (last seizure 1990?).  Otherwise patient is doing well. 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   First seizure of life around age 3 years old, with generalized convulsive seizures. No warning sign for seizures. Patient was initially on Dilantin and then transition to carbamazepine. No seizure since starting carbamazepine.   REVIEW OF SYSTEMS: Out of a complete 14 system review of symptoms, the patient complains only of the following symptoms, none and all other reviewed systems are negative.  ALLERGIES: No Known Allergies  HOME MEDICATIONS: Outpatient Medications Prior to Visit  Medication Sig Dispense Refill   atenolol (TENORMIN) 25 MG tablet 25 mg daily.      atorvastatin (LIPITOR) 80 MG tablet TAKE 1 TABLET BY MOUTH EVERY DAY NEED APPT FOR MORE REFILLS  0   carbamazepine (TEGRETOL) 200 MG tablet Take 2.5 tablets (500 mg total) by mouth 2 (two) times daily. 450 tablet 3   ferrous sulfate 325 (65 FE) MG tablet Take 325 mg by mouth every Monday, Wednesday, and Friday.     folic acid (FOLVITE) 563 MCG tablet Take 400 mcg by mouth. Takes one tablet on Tues and Sat.     Probiotic Product ([employer] PROBIOTIC) CAPS Take 1 tablet by mouth daily.     No facility-administered medications prior to visit.    PAST MEDICAL HISTORY: Past Medical History:  Diagnosis Date   Hypertension    Seizures ([place])     PAST SURGICAL HISTORY: No past surgical history on file.  FAMILY HISTORY: Family History  Problem Relation Age of Onset   Heart disease Mother    Diabetes Mother    Other Mother  infection   Colon cancer Sister    Colon cancer Brother     SOCIAL HISTORY: Social History   Socioeconomic History   Marital status: Single    Spouse name: Not on file   Number of children: 0   Years of education: 12th   Highest education level: Not on file  Occupational History    Employer: [employer].  Tobacco Use   Smoking status: Never   Smokeless tobacco: Never  Substance and Sexual  Activity   Alcohol use: No   Drug use: No   Sexual activity: Not on file  Other Topics Concern   Not on file  Social History Narrative   Patient lives at home with father, twin brother.    Caffeine Use: 2 sodas daily   Never used EtOH, tobacco or illicit drugs.   Attended special education classes; graduated from 12th grade.   Social Determinants of Health   Financial Resource Strain: Not on file  Food Insecurity: Not on file  Transportation Needs: Not on file  Physical Activity: Not on file  Stress: Not on file  Social Connections: Not on file  Intimate Partner Violence: Not on file      PHYSICAL EXAM  There were no vitals filed for this visit.  There is no height or weight on file to calculate BMI.  Generalized: Well developed, in no acute distress  Cardiology: normal rate and rhythm, no murmur noted Respiratory: clear to auscultation bilaterally  Neurological examination  Mentation: Alert, slow responses, Mild MR. Follows all commands speech and language fluent Cranial nerve II-XII: Pupils were equal round reactive to light. Extraocular movements were full, visual field were full on confrontational test. Facial sensation and strength were normal. Uvula tongue midline. Head turning and shoulder shrug  were normal and symmetric. Motor: The motor testing reveals 5 over 5 strength of all 4 extremities. Good symmetric motor tone is noted throughout.  Sensory: Sensory testing is intact to soft touch on all 4 extremities. No evidence of extinction is noted.  Coordination: Cerebellar testing reveals good finger-nose-finger and heel-to-shin bilaterally.  Gait and station: Gait is narrow but stable.   DIAGNOSTIC DATA (LABS, IMAGING, TESTING) - I reviewed patient records, labs, notes, testing and imaging myself where available.  No flowsheet data found.   Lab Results  Component Value Date   WBC 3.0 (L) 10/01/2019   HGB 12.1 (L) 10/01/2019   HCT 36.7 (L) 10/01/2019   MCV 89  10/01/2019   PLT 329 10/01/2019      Component Value Date/Time   NA 137 10/01/2019 0814   NA 140 12/17/2013 1026   K 4.6 10/01/2019 0814   K 4.1 12/17/2013 1026   CL 101 10/01/2019 0814   CO2 25 10/01/2019 0814   CO2 27 12/17/2013 1026   GLUCOSE 97 10/01/2019 0814   GLUCOSE 91 12/17/2013 1026   BUN 7 (L) 10/01/2019 0814   BUN 6.8 (L) 12/17/2013 1026   CREATININE 0.96 10/01/2019 0814   CREATININE 1.0 12/17/2013 1026   CALCIUM 9.3 10/01/2019 0814   CALCIUM 9.3 12/17/2013 1026   PROT 7.3 10/01/2019 0814   PROT 7.6 12/17/2013 1026   ALBUMIN 4.3 10/01/2019 0814   ALBUMIN 3.9 12/17/2013 1026   AST 26 10/01/2019 0814   AST 25 12/17/2013 1026   ALT 21 10/01/2019 0814   ALT 20 12/17/2013 1026   ALKPHOS 87 10/01/2019 0814   ALKPHOS 83 12/17/2013 1026   BILITOT <0.2 10/01/2019 0814   BILITOT  0.26 12/17/2013 1026   GFRNONAA 83 10/01/2019 0814   GFRAA 96 10/01/2019 0814   No results found for: CHOL, HDL, LDLCALC, LDLDIRECT, TRIG, CHOLHDL No results found for: HGBA1C Lab Results  Component Value Date   [id number] 662 12/17/2013   No results found for: TSH     ASSESSMENT AND PLAN 65 y.o. year old male  has a past medical history of Hypertension and Seizures ([place]). here with   No diagnosis found.   Gregory Spence is doing well. He is tolerating carbamazepine 500mg  twice daily with no adverse effects. We will continue current treatment. Will update labs today. He was encouraged to stay well hydrated and work on healthy lifestyle habits with well balanced diet and regular exercise. He will follow up with me in 1 year, sooner if needed.    No orders of the defined types were placed in this encounter.    No orders of the defined types were placed in this encounter.     Debbora Presto, FNP-C 12/22/2020, 4:31 PM [facility] [address] [place], [place] [address] [phone]

## 2021-01-02 ENCOUNTER — Ambulatory Visit: Payer: Medicare Other | Admitting: Family Medicine

## 2021-01-26 ENCOUNTER — Other Ambulatory Visit: Payer: Self-pay

## 2021-01-26 ENCOUNTER — Emergency Department (HOSPITAL_COMMUNITY)
Admission: EM | Admit: 2021-01-26 | Discharge: 2021-01-27 | Disposition: A | Discharge status: Home / Self Care | Payer: Medicare Other | Attending: Emergency Medicine | Admitting: Emergency Medicine

## 2021-01-26 ENCOUNTER — Emergency Department (HOSPITAL_COMMUNITY): Payer: Medicare Other

## 2021-01-26 DIAGNOSIS — R197 Diarrhea, unspecified: Secondary | ICD-10-CM | POA: Diagnosis not present

## 2021-01-26 DIAGNOSIS — W19XXXA Unspecified fall, initial encounter: Secondary | ICD-10-CM | POA: Diagnosis not present

## 2021-01-26 DIAGNOSIS — S59902A Unspecified injury of left elbow, initial encounter: Secondary | ICD-10-CM | POA: Diagnosis present

## 2021-01-26 DIAGNOSIS — S0990XA Unspecified injury of head, initial encounter: Secondary | ICD-10-CM | POA: Insufficient documentation

## 2021-01-26 DIAGNOSIS — S50312A Abrasion of left elbow, initial encounter: Secondary | ICD-10-CM | POA: Insufficient documentation

## 2021-01-26 DIAGNOSIS — I10 Essential (primary) hypertension: Secondary | ICD-10-CM | POA: Diagnosis not present

## 2021-01-26 DIAGNOSIS — S40812A Abrasion of left upper arm, initial encounter: Secondary | ICD-10-CM

## 2021-01-26 LAB — CBC WITH DIFFERENTIAL/PLATELET
Abs Immature Granulocytes: 0.01 10*3/uL (ref 0.00–0.07)
Basophils Absolute: 0 10*3/uL (ref 0.0–0.1)
Basophils Relative: 1 %
Eosinophils Absolute: 0.1 10*3/uL (ref 0.0–0.5)
Eosinophils Relative: 2 %
HCT: 40.2 % (ref 39.0–52.0)
Hemoglobin: 13.1 g/dL (ref 13.0–17.0)
Immature Granulocytes: 0 %
Lymphocytes Relative: 17 %
Lymphs Abs: 1.1 10*3/uL (ref 0.7–4.0)
MCH: 29 pg (ref 26.0–34.0)
MCHC: 32.6 g/dL (ref 30.0–36.0)
MCV: 89.1 fL (ref 80.0–100.0)
Monocytes Absolute: 0.6 10*3/uL (ref 0.1–1.0)
Monocytes Relative: 9 %
Neutro Abs: 4.5 10*3/uL (ref 1.7–7.7)
Neutrophils Relative %: 71 %
Platelets: 310 10*3/uL (ref 150–400)
RBC: 4.51 MIL/uL (ref 4.22–5.81)
RDW: 12.4 % (ref 11.5–15.5)
WBC: 6.4 10*3/uL (ref 4.0–10.5)
nRBC: 0 % (ref 0.0–0.2)

## 2021-01-26 LAB — COMPREHENSIVE METABOLIC PANEL
ALT: 23 U/L (ref 0–44)
AST: 31 U/L (ref 15–41)
Albumin: 4.1 g/dL (ref 3.5–5.0)
Alkaline Phosphatase: 82 U/L (ref 38–126)
Anion gap: 10 (ref 5–15)
BUN: 10 mg/dL (ref 8–23)
CO2: 22 mmol/L (ref 22–32)
Calcium: 9.6 mg/dL (ref 8.9–10.3)
Chloride: 101 mmol/L (ref 98–111)
Creatinine, Ser: 1.17 mg/dL (ref 0.61–1.24)
GFR, Estimated: >60 mL/min (ref >60)
Glucose, Bld: 109 mg/dL — ABNORMAL HIGH (ref 70–99)
Potassium: 4.7 mmol/L (ref 3.5–5.1)
Sodium: 133 mmol/L — ABNORMAL LOW (ref 135–145)
Total Bilirubin: 0.5 mg/dL (ref 0.3–1.2)
Total Protein: 7.9 g/dL (ref 6.5–8.1)

## 2021-01-26 LAB — URINALYSIS, ROUTINE W REFLEX MICROSCOPIC
Bilirubin Urine: NEGATIVE
Glucose, UA: NEGATIVE mg/dL
Hgb urine dipstick: NEGATIVE
Ketones, ur: NEGATIVE mg/dL
Leukocytes,Ua: NEGATIVE
Nitrite: NEGATIVE
Protein, ur: NEGATIVE mg/dL
Specific Gravity, Urine: 1.009 (ref 1.005–1.030)
pH: 5 (ref 5.0–8.0)

## 2021-01-26 MED ORDER — SODIUM CHLORIDE 0.9 % IV BOLUS
1000.0000 mL | Freq: Once | INTRAVENOUS | Status: AC
  Administered 2021-01-26: 1000 mL via INTRAVENOUS

## 2021-01-26 NOTE — ED Triage Notes (Signed)
Pt via EMS from home for eval of two falls today. Pt's family endorses that these falls are d/t weakness. Has been having diarrhea and decreased PO intake x 1 week. Denies loc or dizziness. +orthostatic changes with EMS. No injury from fall other than abrasion to elbow, already bandaged by family.

## 2021-01-26 NOTE — ED Notes (Signed)
Provider made aware of stool sample

## 2021-01-26 NOTE — Discharge Instructions (Addendum)
Lab work today is reassuring, no significant dehydration.  You were given IV fluids.  Recommend recheck with your doctor.

## 2021-01-26 NOTE — ED Provider Notes (Signed)
65 year old male with history of seizure disorder [facility] Provider Note   CSN: [id number] Arrival date & time: 01/26/21  1818     History Chief Complaint  Patient presents with   Gregory Spence is a 65 y.o. male.  65 year old male with history of seizure disorder and intellectual disability brought in by EMS from home after a fall today.  Patient states he has had diarrhea recently and is feeling dehydrated, states that he fell today and scraped his left arm.  Per EMS, family reports diarrhea and decreased oral intake x1 week.  No other complaints or concerns today.      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     Family History  Problem Relation Age of Onset   Heart disease Mother    Diabetes Mother    Other Mother        infection   Colon cancer Sister    Colon cancer Brother     Social History   Tobacco Use   Smoking status: Never   Smokeless tobacco: Never  Substance Use Topics   Alcohol use: No   Drug use: No    Home Medications Prior to Admission medications   Medication Sig Start Date End Date Taking? Authorizing Provider  atenolol (TENORMIN) 25 MG tablet 25 mg daily.  07/27/11   [provider]  atorvastatin (LIPITOR) 80 MG tablet TAKE 1 TABLET BY MOUTH EVERY DAY NEED APPT FOR MORE REFILLS 06/03/15   [provider]  carbamazepine (TEGRETOL) 200 MG tablet Take 2.5 tablets (500 mg total) by mouth 2 (two) times daily. 10/01/19   Lomax, Amy, NP  ferrous sulfate 325 (65 FE) MG tablet Take 325 mg by mouth every Monday, Wednesday, and Friday.    [provider]  folic acid (FOLVITE) A999333 MCG tablet Take  400 mcg by mouth. Takes one tablet on Tues and Sat.    [provider]  Probiotic Product ([employer] PROBIOTIC) CAPS Take 1 tablet by mouth daily. 07/10/18   [provider]    Allergies    Patient has no known allergies.  Review of Systems   Review of Systems  Constitutional:  Negative for fever.  Respiratory:  Negative for shortness of breath.   Cardiovascular:  Negative for chest pain.  Gastrointestinal:  Positive for diarrhea. Negative for abdominal pain, blood in stool, nausea and vomiting.  Musculoskeletal:  Negative for arthralgias, back pain, myalgias and neck pain.  Skin:  Positive for wound.  Neurological:  Negative for weakness.  Hematological:  Negative for adenopathy.  Psychiatric/Behavioral:  Negative for confusion.   All other systems reviewed and are negative.  Physical Exam Updated Vital Signs BP (!) 142/75 (BP Location: Right Arm)   Pulse 69   Temp 98.8 F (37.1 C) (Oral)   Resp 16   SpO2 100%   Physical Exam Vitals and nursing note reviewed.  Constitutional:      General: He is not in acute distress.    Appearance: He is well-developed. He is not diaphoretic.  HENT:     Head: Normocephalic and atraumatic.     Mouth/Throat:  Mouth: Mucous membranes are moist.  Eyes:     Conjunctiva/sclera: Conjunctivae normal.  Cardiovascular:     Rate and Rhythm: Normal rate and regular rhythm.     Pulses: Normal pulses.     Heart sounds: Normal heart sounds.  Pulmonary:     Effort: Pulmonary effort is normal.     Breath sounds: Normal breath sounds.  Abdominal:     Palpations: Abdomen is soft.     Tenderness: There is no abdominal tenderness.  Musculoskeletal:        General: Signs of injury present. No swelling or tenderness.     Cervical back: Neck supple. No tenderness or bony tenderness. No pain with movement. Normal range of motion.     Thoracic back: No tenderness or bony tenderness.     Lumbar back: No tenderness or bony tenderness.      Right lower leg: No edema.     Left lower leg: No edema.     Comments: Minor abrasion to left elbow without any swelling, ecchymosis or bony tenderness.  No pain with range of motion or palpation upper or lower extremities.    Skin:    General: Skin is warm and dry.     Findings: No erythema or rash.  Neurological:     Mental Status: He is alert and oriented to person, place, and time.     Sensory: No sensory deficit.     Motor: No weakness.  Psychiatric:        Behavior: Behavior normal.    ED Results / Procedures / Treatments   Labs (all labs ordered are listed, but only abnormal results are displayed) Labs Reviewed  COMPREHENSIVE METABOLIC PANEL - Abnormal; Notable for the following components:      Result Value   Sodium 133 (*)    Glucose, Bld 109 (*)    All other components within normal limits  URINALYSIS, ROUTINE W REFLEX MICROSCOPIC - Abnormal; Notable for the following components:   APPearance HAZY (*)    All other components within normal limits  C DIFFICILE QUICK SCREEN W PCR REFLEX    CBC WITH DIFFERENTIAL/PLATELET    EKG None  Radiology CT Head Wo Contrast  Result Date: 01/26/2021 CLINICAL DATA:  Head trauma, minor (Age >= 65y); Neck trauma (Age >= 65y). Generalized weakness with multiple falls. EXAM: CT HEAD WITHOUT CONTRAST CT CERVICAL SPINE WITHOUT CONTRAST TECHNIQUE: Multidetector CT imaging of the head and cervical spine was performed following the standard protocol without intravenous contrast. Multiplanar CT image reconstructions of the cervical spine were also generated. COMPARISON:  None. FINDINGS: CT HEAD FINDINGS Brain: Normal anatomic configuration. Parenchymal volume loss is commensurate with the patient's age. Mild periventricular white matter changes are present likely reflecting the sequela of small vessel ischemia. No abnormal intra or extra-axial mass lesion or fluid collection. No abnormal mass effect or midline shift. No evidence of acute  intracranial hemorrhage or infarct. Ventricular size is normal. Cerebellum unremarkable. Vascular: No asymmetric hyperdense vasculature at the skull base. Skull: Intact Sinuses/Orbits: Mild mucosal thickening and mucous retention cysts within the visualized maxillary sinuses. No air-fluid levels. Remaining paranasal sinuses are clear. Orbits are unremarkable. Other: Mastoid air cells and middle ear cavities are clear. CT CERVICAL SPINE FINDINGS Alignment: Normal. Skull base and vertebrae: No acute fracture. No primary bone lesion or focal pathologic process. Soft tissues and spinal canal: Choose Disc levels: Mild intervertebral disc space narrowing and endplate remodeling at 075-GRM in keeping with changes of mild degenerative disc disease.  Remaining intervertebral disc heights are preserved. Vertebral body heights are preserved. The prevertebral soft tissues are not thickened on sagittal reformats. Review of the axial images demonstrates mild right neuroforaminal narrowing at C3-4 and C4-5 secondary to uncovertebral arthrosis. Remaining neural foramina are widely patent. Spinal canal is widely patent. Upper chest: Unremarkable Other: None IMPRESSION: No acute intracranial abnormality.  No calvarial fracture. No acute fracture or listhesis of the cervical spine. Electronically Signed   By: Fidela Salisbury MD   On: 01/26/2021 20:25   CT Cervical Spine Wo Contrast  Result Date: 01/26/2021 CLINICAL DATA:  Head trauma, minor (Age >= 65y); Neck trauma (Age >= 65y). Generalized weakness with multiple falls. EXAM: CT HEAD WITHOUT CONTRAST CT CERVICAL SPINE WITHOUT CONTRAST TECHNIQUE: Multidetector CT imaging of the head and cervical spine was performed following the standard protocol without intravenous contrast. Multiplanar CT image reconstructions of the cervical spine were also generated. COMPARISON:  None. FINDINGS: CT HEAD FINDINGS Brain: Normal anatomic configuration. Parenchymal volume loss is commensurate with the  patient's age. Mild periventricular white matter changes are present likely reflecting the sequela of small vessel ischemia. No abnormal intra or extra-axial mass lesion or fluid collection. No abnormal mass effect or midline shift. No evidence of acute intracranial hemorrhage or infarct. Ventricular size is normal. Cerebellum unremarkable. Vascular: No asymmetric hyperdense vasculature at the skull base. Skull: Intact Sinuses/Orbits: Mild mucosal thickening and mucous retention cysts within the visualized maxillary sinuses. No air-fluid levels. Remaining paranasal sinuses are clear. Orbits are unremarkable. Other: Mastoid air cells and middle ear cavities are clear. CT CERVICAL SPINE FINDINGS Alignment: Normal. Skull base and vertebrae: No acute fracture. No primary bone lesion or focal pathologic process. Soft tissues and spinal canal: Choose Disc levels: Mild intervertebral disc space narrowing and endplate remodeling at 075-GRM in keeping with changes of mild degenerative disc disease. Remaining intervertebral disc heights are preserved. Vertebral body heights are preserved. The prevertebral soft tissues are not thickened on sagittal reformats. Review of the axial images demonstrates mild right neuroforaminal narrowing at C3-4 and C4-5 secondary to uncovertebral arthrosis. Remaining neural foramina are widely patent. Spinal canal is widely patent. Upper chest: Unremarkable Other: None IMPRESSION: No acute intracranial abnormality.  No calvarial fracture. No acute fracture or listhesis of the cervical spine. Electronically Signed   By: Fidela Salisbury MD   On: 01/26/2021 20:25    Procedures Procedures   Medications Ordered in ED Medications  sodium chloride 0.9 % bolus 1,000 mL (1,000 mLs Intravenous New Bag/Given 01/26/21 2045)    ED Course  I have reviewed the triage vital signs and the nursing notes.  Pertinent labs & imaging results that were available during my care of the patient were reviewed by me  and considered in my medical decision making (see chart for details).  Clinical Course as of 01/27/21 0044  Fri Jan 28, 3532  414 65 year old male presents after fall today with minor abrasion to the elbow, family with concern for dehydration due to diarrhea. CT head and C-spine unremarkable, found to have small abrasion to the left elbow without any bony tenderness or swelling. Patient was given IV fluids, labs unremarkable including CBC, CMP, urinalysis.  Patient did have 1 watery yellow stool which has been sent for C. difficile testing.  If positive, he may be discharged on oral antibiotics.  If negative, does not need further treatment at this time. [LM]    Clinical Course User Index [LM] Roque Lias   MDM Rules/Calculators/A&P  Final Clinical Impression(s) / ED Diagnoses Final diagnoses:  Fall, initial encounter  Abrasion of left upper extremity, initial encounter  Diarrhea, unspecified type    Rx / DC Orders ED Discharge Orders     None        Tacy Learn, PA-C 01/27/21 0044    Fredia Sorrow, MD 02/03/21 1540

## 2021-01-26 NOTE — ED Notes (Signed)
Pt NAD in gurney, a/ox4, c.o diarrhea x 1 week, denies n/v or ABD pain. ABD soft non tender. +dizziness on standing. Denies CP, SOB

## 2021-01-27 LAB — C DIFFICILE QUICK SCREEN W PCR REFLEX
C Diff antigen: NEGATIVE
C Diff interpretation: NOT DETECTED
C Diff toxin: NEGATIVE

## 2021-01-27 NOTE — ED Notes (Signed)
Pt NAD in bed, a/ox4. C/o diarrhea x 7 days. Denies ABD pain, n/v or sick contacts.

## 2021-01-27 NOTE — ED Notes (Signed)
Pt up to restroom.

## 2021-04-10 ENCOUNTER — Ambulatory Visit (INDEPENDENT_AMBULATORY_CARE_PROVIDER_SITE_OTHER): Payer: Medicare Other | Admitting: Family Medicine

## 2021-04-10 ENCOUNTER — Encounter: Payer: Self-pay | Admitting: Family Medicine

## 2021-04-10 VITALS — BP 164/73 | HR 68 | Ht 74.0 in | Wt 145.5 lb

## 2021-04-10 DIAGNOSIS — G40909 Epilepsy, unspecified, not intractable, without status epilepticus: Secondary | ICD-10-CM | POA: Diagnosis not present

## 2021-04-10 MED ORDER — CARBAMAZEPINE 200 MG PO TABS: 500.0000 mg | ORAL_TABLET | Freq: Two times a day (BID) | ORAL | 3 refills | Status: DC

## 2021-04-10 NOTE — Progress Notes (Signed)
PATIENT: Gregory Spence DOB: 1955/10/28  REASON FOR VISIT: follow up HISTORY FROM: patient  Chief Complaint  Patient presents with   Follow-up    Rm 2, alone. Here for yearly SZ f/u. Pt reports doing well. No sz like activity since last OV. Pt would like his folic acid and iron filled today.       HISTORY OF PRESENT ILLNESS: 04/10/21 ALL: Gregory Spence returns for follow up for seizures. He continues carbamazepine 500mg  BID. He is tolerating well and without obvious adverse effects. He denies seizure activity since last being seen. He is living with his twin brother Gregory Spence. His sister recently moved back to [place]. He is able to manage his medications. He does not drive. He is here with his friend, today.   He was seen in the ER 01/26/2021 following a fall in the setting of weakness from diarrhea x 1 week. No major injuries and workup was unremarkable. Na was 133. He has an appt with Dr Luciana Axe 10/24 for follow up and blood work. He reports feeling much better and diarrhea is resolved.   10/01/2019 ALL:  Gregory Spence is a 65 y.o. male here today for follow up for seizures. He continues carbamazepine 500mg  twice daily. He is tolerating medication well with no obvious adverse effects. He denies seizure activity. He is seen yearly by PCP. No longer seeing hematology. He has received his first of two vaccines and anticipates getting the second on 4/19. He is living with is sister. He is feeling well today.    HISTORY: (copied from [name] note on 08/11/2018)  UPDATE (08/11/18, VRP): Since last visit, doing well. Symptoms are stable. No alleviating or aggravating factors. Tolerating CBZ. No seizures.   UPDATE (07/29/17, CM): 65 year old male returns for yearly follow-up. He reestablished care here with  [name] 09/25/2013. He had a previous patient of ours with Dr. [name] who has left the practice. He has a history of static encephalopathy and mild mental retardation and seizure disorder  that has been well-controlled on carbamazepine. Last seizure event occurred September 1990. He also has a congenital left hemiparesis. Denies any daytime drowsiness headaches visual disturbance no falls. Denies missing any doses of his medications. He returns for reevaluation. Mother passed away in 06/23/2014. She was primary caretaker. CBC  followed through oncology for his anemia. He needs liver function testing and refills. He has no new complaints.  He reports appetite is good.    PRIOR HPI (09/25/13, VRP): 65 year old male with history of mild mental retardation, static encephalopathy, mild congential left hemiparesis, seizure disorder, here to establish again for seizure disorder management.   Patient takes carbamazepine 200 mg tablets, 2.5 tablets twice a day. Patient has not had a seizure in over 20 years (last seizure 1990?). Otherwise patient is doing well. He continues to work for [employer].   First seizure of life around age 9 years old, with generalized convulsive seizures. No warning sign for seizures. Patient was initially on Dilantin and then transition to carbamazepine. No seizure since starting carbamazepine.   REVIEW OF SYSTEMS: Out of a complete 14 system review of symptoms, the patient complains only of the following symptoms, none and all other reviewed systems are negative.  ALLERGIES: No Known Allergies  HOME MEDICATIONS: Outpatient Medications Prior to Visit  Medication Sig Dispense Refill   atenolol (TENORMIN) 25 MG tablet 25 mg daily.      atorvastatin (LIPITOR) 80 MG tablet TAKE 1 TABLET BY MOUTH EVERY  DAY NEED APPT FOR MORE REFILLS  0   ferrous sulfate 325 (65 FE) MG tablet Take 325 mg by mouth every Monday, Wednesday, and Friday.     folic acid (FOLVITE) 097 MCG tablet Take 400 mcg by mouth. Takes one tablet on Tues and Sat.     Probiotic Product ([employer] PROBIOTIC) CAPS Take 1 tablet by mouth daily.     carbamazepine (TEGRETOL) 200 MG tablet Take 2.5  tablets (500 mg total) by mouth 2 (two) times daily. 450 tablet 3   No facility-administered medications prior to visit.    PAST MEDICAL HISTORY: Past Medical History:  Diagnosis Date   Hypertension    Seizures ([place])     PAST SURGICAL HISTORY: History reviewed. No pertinent surgical history.  FAMILY HISTORY: Family History  Problem Relation Age of Onset   Heart disease Mother    Diabetes Mother    Other Mother        infection   Colon cancer Sister    Colon cancer Brother     SOCIAL HISTORY: Social History   Socioeconomic History   Marital status: Single    Spouse name: Not on file   Number of children: 0   Years of education: 12th   Highest education level: Not on file  Occupational History    Employer: [employer].  Tobacco Use   Smoking status: Never   Smokeless tobacco: Never  Substance and Sexual Activity   Alcohol use: No   Drug use: No   Sexual activity: Not on file  Other Topics Concern   Not on file  Social History Narrative   Patient lives at home with father, twin brother.    Caffeine Use: 2 sodas daily   Never used EtOH, tobacco or illicit drugs.   Attended special education classes; graduated from 12th grade.   Social Determinants of Health   Financial Resource Strain: Not on file  Food Insecurity: Not on file  Transportation Needs: Not on file  Physical Activity: Not on file  Stress: Not on file  Social Connections: Not on file  Intimate Partner Violence: Not on file      PHYSICAL EXAM  Vitals:   04/10/21 1449  BP: (!) 164/73  Pulse: 68  Weight: 145 lb 8 oz (66 kg)  Height: 6\' 2"  (1.88 m)    Body mass index is 18.68 kg/m.  Generalized: Well developed, in no acute distress  Cardiology: normal rate and rhythm, no murmur noted Respiratory: clear to auscultation bilaterally  Neurological examination  Mentation: Alert, slow responses, Mild MR. Follows all commands speech and language fluent Cranial nerve II-XII:  Pupils were equal round reactive to light. Extraocular movements were full, visual field were full on confrontational test. Facial sensation and strength were normal.  Head turning and shoulder shrug  were normal and symmetric. Motor: The motor testing reveals 5 over 5 strength of all 4 extremities. Good symmetric motor tone is noted throughout.  Sensory: Sensory testing is intact to soft touch on all 4 extremities. No evidence of extinction is noted.  Coordination: Cerebellar testing reveals good finger-nose-finger and heel-to-shin bilaterally.  Gait and station: Gait is narrow but stable.    DIAGNOSTIC DATA (LABS, IMAGING, TESTING) - I reviewed patient records, labs, notes, testing and imaging myself where available.  No flowsheet data found.   Lab Results  Component Value Date   WBC 6.4 01/26/2021   HGB 13.1 01/26/2021   HCT 40.2 01/26/2021   MCV 89.1 01/26/2021  PLT 310 01/26/2021      Component Value Date/Time   NA 133 (L) 01/26/2021 2026   NA 137 10/01/2019 0814   NA 140 12/17/2013 1026   K 4.7 01/26/2021 2026   K 4.1 12/17/2013 1026   CL 101 01/26/2021 2026   CO2 22 01/26/2021 2026   CO2 27 12/17/2013 1026   GLUCOSE 109 (H) 01/26/2021 2026   GLUCOSE 91 12/17/2013 1026   BUN 10 01/26/2021 2026   BUN 7 (L) 10/01/2019 0814   BUN 6.8 (L) 12/17/2013 1026   CREATININE 1.17 01/26/2021 2026   CREATININE 1.0 12/17/2013 1026   CALCIUM 9.6 01/26/2021 2026   CALCIUM 9.3 12/17/2013 1026   PROT 7.9 01/26/2021 2026   PROT 7.3 10/01/2019 0814   PROT 7.6 12/17/2013 1026   ALBUMIN 4.1 01/26/2021 2026   ALBUMIN 4.3 10/01/2019 0814   ALBUMIN 3.9 12/17/2013 1026   AST 31 01/26/2021 2026   AST 25 12/17/2013 1026   ALT 23 01/26/2021 2026   ALT 20 12/17/2013 1026   ALKPHOS 82 01/26/2021 2026   ALKPHOS 83 12/17/2013 1026   BILITOT 0.5 01/26/2021 2026   BILITOT <0.2 10/01/2019 0814   BILITOT 0.26 12/17/2013 1026   GFRNONAA >60 01/26/2021 2026   GFRAA 96 10/01/2019 0814   No  results found for: CHOL, HDL, LDLCALC, LDLDIRECT, TRIG, CHOLHDL No results found for: HGBA1C Lab Results  Component Value Date   NLZJQBHA19 379 12/17/2013   No results found for: TSH     ASSESSMENT AND PLAN 65 y.o. year old male  has a past medical history of Hypertension and Seizures ([place]). here with     ICD-10-CM   1. Seizure disorder ([place])  G40.909 Carbamazepine level, total       Travell is doing well. He is tolerating carbamazepine 500mg  twice daily with no adverse effects. We will continue current treatment. We have discussed updating labs, today. He has an appt with his PCP on 10/24 and requesting that labs be drawn with Dr Merilyn Baba office. I will place order in Epic for carbamazepine level to be collected. We will refill AED and he will request all other refills through PCP.  He was encouraged to stay well hydrated and work on healthy lifestyle habits with well balanced diet and regular exercise. He will follow up with me in 1 year, sooner if needed.    Orders Placed This Encounter  Procedures   Carbamazepine level, total    Standing Status:   Future    Standing Expiration Date:   04/10/2022      Meds ordered this encounter  Medications   carbamazepine (TEGRETOL) 200 MG tablet    Sig: Take 2.5 tablets (500 mg total) by mouth 2 (two) times daily.    Dispense:  450 tablet    Refill:  3    Order Specific Question:   Supervising Provider    Answer:   Melvenia Beam [[id number]]       ZHG DJMEQ, FNP-C 04/10/2021, 4:15 PM [facility] [address] [place], [place] [address] [phone]

## 2021-04-10 NOTE — Addendum Note (Signed)
Addended by: Artist Beach on: 04/10/2021 04:32 PM   Modules accepted: Orders

## 2021-04-10 NOTE — Patient Instructions (Addendum)
Below is our plan:  We will continue carbamazepine 500mg  twice daily (2.5 tablets twice daily). I will order labs to check your carbamazepine level. I will ask Dr Merilyn Baba office to collect this when you come in to see her next Monday 10/24. Please let me know if there are any concerns.   Please make sure you are staying well hydrated. I recommend 50-60 ounces daily. Well balanced diet and regular exercise encouraged. Consistent sleep schedule with 6-8 hours recommended.   Please continue follow up with care team as directed.   Follow up with me in 1 year, sooner if needed.   You may receive a survey regarding today's visit. I encourage you to leave honest feed back as I do use this information to improve patient care. Thank you for seeing me today!

## 2021-04-12 ENCOUNTER — Ambulatory Visit: Payer: Medicare Other | Admitting: Family Medicine

## 2021-04-24 ENCOUNTER — Telehealth: Payer: Self-pay | Admitting: *Deleted

## 2021-04-24 NOTE — Telephone Encounter (Signed)
Called pt. PCP had to move his appt from 04/17/21 to January. He will try and come by our office this week to complete lab work that was ordered. Can only come Mon/Wed/Fri. I made him aware we are closed on Friday's. He verbalized understanding.

## 2021-04-24 NOTE — Telephone Encounter (Signed)
-----   Message from Debbora Presto, NP sent at 04/24/2021  8:14 AM EDT ----- Can you guys check with Gregory Spence to see if his PCP ordered the carbamazepine level for im as discussed in the office. If not, he can swing by our office to get this. TY! ----- Message ----- From: SYSTEM Sent: 04/15/2021  12:17 AM EDT To: Debbora Presto, NP

## 2021-04-26 ENCOUNTER — Other Ambulatory Visit (INDEPENDENT_AMBULATORY_CARE_PROVIDER_SITE_OTHER): Payer: Self-pay

## 2021-04-26 ENCOUNTER — Other Ambulatory Visit: Payer: Self-pay | Admitting: *Deleted

## 2021-04-26 DIAGNOSIS — Z9229 Personal history of other drug therapy: Secondary | ICD-10-CM

## 2021-04-26 DIAGNOSIS — Z0289 Encounter for other administrative examinations: Secondary | ICD-10-CM

## 2021-04-26 DIAGNOSIS — G40909 Epilepsy, unspecified, not intractable, without status epilepticus: Secondary | ICD-10-CM

## 2021-04-27 ENCOUNTER — Telehealth: Payer: Self-pay | Admitting: *Deleted

## 2021-04-27 LAB — CARBAMAZEPINE LEVEL, TOTAL: Carbamazepine (Tegretol), S: 14.4 ug/mL (ref 4.0–12.0)

## 2021-04-27 NOTE — Telephone Encounter (Signed)
Called pt. Relayed results per AL,NP note. He verbalized understanding. He normally takes carbamazepine at 8am, 8pm. He did not feel he could come at 8am for lab draw. Scheduled appt for 05/24/21 at 9am. He will hold carbamazepine dose until after he gets labs drawn. He will bring with him to take after lab drawn.

## 2021-04-27 NOTE — Telephone Encounter (Signed)
-----   Message from Debbora Presto, NP sent at 04/27/2021  4:44 PM EDT ----- Carbamazepine level is a little high but he seems to be doing well. No dose changes at this time. I will have him repeat level in 1 month just before dose of medication so we are sure to get a good trough level. TY

## 2021-05-24 ENCOUNTER — Other Ambulatory Visit: Payer: Self-pay | Admitting: Neurology

## 2021-05-24 ENCOUNTER — Other Ambulatory Visit (INDEPENDENT_AMBULATORY_CARE_PROVIDER_SITE_OTHER): Payer: Self-pay

## 2021-05-24 ENCOUNTER — Other Ambulatory Visit: Payer: Self-pay

## 2021-05-24 DIAGNOSIS — Z79899 Other long term (current) drug therapy: Secondary | ICD-10-CM

## 2021-05-24 DIAGNOSIS — Z0289 Encounter for other administrative examinations: Secondary | ICD-10-CM

## 2021-05-24 DIAGNOSIS — Z9229 Personal history of other drug therapy: Secondary | ICD-10-CM

## 2021-05-24 DIAGNOSIS — G40909 Epilepsy, unspecified, not intractable, without status epilepticus: Secondary | ICD-10-CM

## 2021-05-25 ENCOUNTER — Telehealth: Payer: Self-pay | Admitting: Neurology

## 2021-05-25 LAB — CARBAMAZEPINE LEVEL, TOTAL: Carbamazepine (Tegretol), S: 12.1 ug/mL — ABNORMAL HIGH (ref 4.0–12.0)

## 2021-05-25 NOTE — Telephone Encounter (Signed)
Attempted to call the patient to discuss lab results. There was no answer and no VM. Will try again later,  ** It pt returns call please advise that his lab work (that we check because of his medicine for seizures) looked good. No concerns from Amy.

## 2021-05-25 NOTE — Telephone Encounter (Signed)
-----   Message from Debbora Presto, NP sent at 05/25/2021 10:17 AM EST ----- AED level looks good! Follow up as directed.

## 2021-05-30 ENCOUNTER — Encounter: Payer: Self-pay | Admitting: *Deleted

## 2021-05-30 NOTE — Telephone Encounter (Addendum)
Tried calling pt at [phone]. Phone continued to ring. Called [phone]. Mailbox full, unable to leave message. Will send letter since unable to reach by phone.

## 2021-11-07 ENCOUNTER — Encounter: Payer: Self-pay | Admitting: Gastroenterology

## 2021-11-28 ENCOUNTER — Encounter: Payer: Self-pay | Admitting: Gastroenterology

## 2022-01-10 ENCOUNTER — Ambulatory Visit (AMBULATORY_SURGERY_CENTER): Payer: Medicare Other | Admitting: *Deleted

## 2022-01-10 VITALS — Ht 74.0 in | Wt 152.0 lb

## 2022-01-10 DIAGNOSIS — Z1211 Encounter for screening for malignant neoplasm of colon: Secondary | ICD-10-CM

## 2022-01-10 MED ORDER — NA SULFATE-K SULFATE-MG SULF 17.5-3.13-1.6 GM/177ML PO SOLN
2.0000 | Freq: Once | ORAL | 0 refills | Status: AC
Start: 2022-01-10 — End: 2022-01-10

## 2022-01-10 NOTE — Progress Notes (Signed)

## 2022-02-07 ENCOUNTER — Ambulatory Visit (AMBULATORY_SURGERY_CENTER): Payer: Medicare Other | Admitting: Gastroenterology

## 2022-02-07 ENCOUNTER — Encounter: Payer: Self-pay | Admitting: Gastroenterology

## 2022-02-07 VITALS — BP 107/59 | HR 72 | Temp 97.1°F | Resp 15 | Ht 74.0 in | Wt 152.0 lb

## 2022-02-07 DIAGNOSIS — D122 Benign neoplasm of ascending colon: Secondary | ICD-10-CM

## 2022-02-07 DIAGNOSIS — D123 Benign neoplasm of transverse colon: Secondary | ICD-10-CM | POA: Diagnosis not present

## 2022-02-07 DIAGNOSIS — Z1211 Encounter for screening for malignant neoplasm of colon: Secondary | ICD-10-CM | POA: Diagnosis not present

## 2022-02-07 MED ORDER — SODIUM CHLORIDE 0.9 % IV SOLN: 500.0000 mL | Freq: Once | INTRAVENOUS | Status: DC

## 2022-02-07 NOTE — Progress Notes (Signed)
[facility] History and Physical   Primary Care Physician:  Bartholome Bill, MD   Reason for Procedure:   Colon cancer screening  Plan:    Screening colonoscopy     HPI: Gregory Spence is a 66 y.o. male undergoing average risk screening colonoscopy.  He has no family history of colon cancer and no chronic GI symptoms other than occasional diarrhea.  He had a normal colonoscopy in 2013.   Past Medical History:  Diagnosis Date   Anemia    Hypertension    Seizures ([place])     History reviewed. No pertinent surgical history.  Prior to Admission medications   Medication Sig Start Date End Date Taking? Authorizing Provider  atenolol (TENORMIN) 25 MG tablet 25 mg daily.  07/27/11  Yes [provider]  atorvastatin (LIPITOR) 80 MG tablet TAKE 1 TABLET BY MOUTH EVERY DAY NEED APPT FOR MORE REFILLS 06/03/15  Yes [provider]  carbamazepine (TEGRETOL) 200 MG tablet Take 2.5 tablets (500 mg total) by mouth 2 (two) times daily. 04/10/21  Yes Lomax, Amy, NP  ferrous sulfate 325 (65 FE) MG tablet Take 325 mg by mouth every Monday, Wednesday, and Friday.   Yes [provider]  folic acid (FOLVITE) 782 MCG tablet Take 400 mcg by mouth. Takes one tablet on Tues and Sat.   Yes [provider]  Probiotic Product ([employer] PROBIOTIC) CAPS Take 1 tablet by mouth daily. 07/10/18  Yes [provider]    Current Outpatient Medications  Medication Sig Dispense Refill   atenolol (TENORMIN) 25 MG tablet 25 mg daily.      atorvastatin (LIPITOR) 80 MG tablet TAKE 1 TABLET BY MOUTH EVERY DAY NEED APPT FOR MORE REFILLS  0   carbamazepine (TEGRETOL) 200 MG tablet Take 2.5 tablets (500 mg total) by mouth 2 (two) times daily. 450 tablet 3   ferrous sulfate 325 (65 FE) MG tablet Take 325 mg by mouth every Monday, Wednesday, and Friday.     folic acid (FOLVITE) 956 MCG tablet Take 400 mcg by mouth. Takes one tablet on Tues and Sat.     Probiotic Product ([employer]  PROBIOTIC) CAPS Take 1 tablet by mouth daily.     Current Facility-Administered Medications  Medication Dose Route Frequency Provider Last Rate Last Admin   0.9 %  sodium chloride infusion  500 mL Intravenous Once Daryel November, MD        Allergies as of 02/07/2022   (No Known Allergies)    Family History  Problem Relation Age of Onset   Heart disease Mother    Diabetes Mother    Other Mother        infection   Colon cancer Sister    Colon cancer Brother    Colon polyps Neg Hx    Esophageal cancer Neg Hx    Stomach cancer Neg Hx    Rectal cancer Neg Hx     Social History   Socioeconomic History   Marital status: Single    Spouse name: Not on file   Number of children: 0   Years of education: 12th   Highest education level: Not on file  Occupational History    Employer: [employer].  Tobacco Use   Smoking status: Never   Smokeless tobacco: Never  Vaping Use   Vaping Use: Never used  Substance and Sexual Activity   Alcohol use: No   Drug use: No   Sexual activity: Not on file  Other  Topics Concern   Not on file  Social History Narrative   Patient lives at home with father, twin brother.    Caffeine Use: 2 sodas daily   Never used EtOH, tobacco or illicit drugs.   Attended special education classes; graduated from 12th grade.   Social Determinants of Health   Financial Resource Strain: Not on file  Food Insecurity: Not on file  Transportation Needs: Not on file  Physical Activity: Not on file  Stress: Not on file  Social Connections: Not on file  Intimate Partner Violence: Not on file    Review of Systems:  All other review of systems negative except as mentioned in the HPI.  Physical Exam: Vital signs BP (!) 147/69   Pulse 90   Temp (!) 97.1 F (36.2 C)   Resp 15   Ht '6\' 2"'$  (1.88 m)   Wt 152 lb (68.9 kg)   SpO2 100%   BMI 19.52 kg/m   General:   Alert,  Well-developed, well-nourished, pleasant and cooperative in  NAD Airway:  Mallampati 1 Lungs:  Clear throughout to auscultation.   Heart:  Regular rate and rhythm; no murmurs, clicks, rubs,  or gallops. Abdomen:  Soft, nontender and nondistended. Normal bowel sounds.   Neuro/Psych:  Normal mood and affect. A and O x 3   Camrin Lapre E. Candis Schatz, MD [facility]

## 2022-02-07 NOTE — Progress Notes (Signed)
Pt's states no medical or surgical changes since previsit or office visit. 

## 2022-02-07 NOTE — Patient Instructions (Signed)
Handouts on polyps and hemorrhoids given to you today    YOU HAD AN ENDOSCOPIC PROCEDURE TODAY AT [facility]:   Refer to the procedure report that was given to you for any specific questions about what was found during the examination.  If the procedure report does not answer your questions, please call your gastroenterologist to clarify.  If you requested that your care partner not be given the details of your procedure findings, then the procedure report has been included in a sealed envelope for you to review at your convenience later.  YOU SHOULD EXPECT: Some feelings of bloating in the abdomen. Passage of more gas than usual.  Walking can help get rid of the air that was put into your GI tract during the procedure and reduce the bloating. If you had a lower endoscopy (such as a colonoscopy or flexible sigmoidoscopy) you may notice spotting of blood in your stool or on the toilet paper. If you underwent a bowel prep for your procedure, you may not have a normal bowel movement for a few days.  Please Note:  You might notice some irritation and congestion in your nose or some drainage.  This is from the oxygen used during your procedure.  There is no need for concern and it should clear up in a day or so.  SYMPTOMS TO REPORT IMMEDIATELY:  Following lower endoscopy (colonoscopy or flexible sigmoidoscopy):  Excessive amounts of blood in the stool  Significant tenderness or worsening of abdominal pains  Swelling of the abdomen that is new, acute  Fever of 100F or higher  For urgent or emergent issues, a gastroenterologist can be reached at any hour by calling [phone]. Do not use MyChart messaging for urgent concerns.    DIET:  We do recommend a small meal at first, but then you may proceed to your regular diet.  Drink plenty of fluids but you should avoid alcoholic beverages for 24 hours.  ACTIVITY:  You should plan to take it easy for the rest of today and you should  NOT DRIVE or use heavy machinery until tomorrow (because of the sedation medicines used during the test).    FOLLOW UP: Our staff will call the number listed on your records the next business day following your procedure.  We will call around 7:15- 8:00 am to check on you and address any questions or concerns that you may have regarding the information given to you following your procedure. If we do not reach you, we will leave a message.  If you develop any symptoms (ie: fever, flu-like symptoms, shortness of breath, cough etc.) before then, please call [phone].  If you test positive for Covid 19 in the 2 weeks post procedure, please call and report this information to [place].    If any biopsies were taken you will be contacted by phone or by letter within the next 1-3 weeks.  Please call us at [phone] if you have not heard about the biopsies in 3 weeks.    SIGNATURES/CONFIDENTIALITY: You and/or your care partner have signed paperwork which will be entered into your electronic medical record.  These signatures attest to the fact that that the information above on your After Visit Summary has been reviewed and is understood.  Full responsibility of the confidentiality of this discharge information lies with you and/or your care-partner.

## 2022-02-07 NOTE — Op Note (Signed)
[facility] Patient Name: Gregory Spence Procedure Date: 02/07/2022 11:32 AM MRN: [id number] Endoscopist: Nicki Reaper E. Candis Schatz , MD Age: 66 Referring MD:  Date of Birth: 1956-03-07 Gender: Male Account #: [id number] Procedure:                Colonoscopy Indications:              Screening for colorectal malignant neoplasm (last                            colonoscopy was 10 years ago) Medicines:                Monitored Anesthesia Care Procedure:                Pre-Anesthesia Assessment:                           - Prior to the procedure, a History and Physical                            was performed, and patient medications and                            allergies were reviewed. The patient's tolerance of                            previous anesthesia was also reviewed. The risks                            and benefits of the procedure and the sedation                            options and risks were discussed with the patient.                            All questions were answered, and informed consent                            was obtained. Prior Anticoagulants: The patient has                            taken no previous anticoagulant or antiplatelet                            agents. ASA Grade Assessment: III - A patient with                            severe systemic disease. After reviewing the risks                            and benefits, the patient was deemed in                            satisfactory condition to undergo the procedure.  After obtaining informed consent, the colonoscope                            was passed under direct vision. Throughout the                            procedure, the patient's blood pressure, pulse, and                            oxygen saturations were monitored continuously. The                            Olympus PCF-H190DL [id number]) Colonoscope was                            introduced through the anus  and advanced to the the                            terminal ileum, with identification of the                            appendiceal orifice and IC valve. The colonoscopy                            was performed without difficulty. The patient                            tolerated the procedure well. The quality of the                            bowel preparation was good. The terminal ileum,                            ileocecal valve, appendiceal orifice, and rectum                            were photographed. The bowel preparation used was                            SUPREP via split dose instruction. Scope In: 11:42:23 AM Scope Out: 12:03:40 PM Scope Withdrawal Time: 0 hours 17 minutes 29 seconds  Total Procedure Duration: 0 hours 21 minutes 17 seconds  Findings:                 The perianal and digital rectal examinations were                            normal. Pertinent negatives include normal                            sphincter tone and no palpable rectal lesions.                           A 3 mm polyp was found in the ascending colon. The  polyp was sessile. The polyp was removed with a                            cold snare. Resection and retrieval were complete.                            Estimated blood loss was minimal.                           A 4 mm polyp was found in the distal transverse                            colon. The polyp was sessile. The polyp was removed                            with a cold snare. Resection and retrieval were                            complete. Estimated blood loss was minimal.                           The exam was otherwise normal throughout the                            examined colon.                           The terminal ileum appeared normal.                           Non-bleeding internal hemorrhoids were found during                            retroflexion. The hemorrhoids were large and Grade                             I (internal hemorrhoids that do not prolapse).                           No additional abnormalities were found on                            retroflexion. Complications:            No immediate complications. Estimated Blood Loss:     Estimated blood loss was minimal. Impression:               - One 3 mm polyp in the ascending colon, removed                            with a cold snare. Resected and retrieved.                           - One 4 mm polyp in the distal transverse colon,  removed with a cold snare. Resected and retrieved.                           - The examined portion of the ileum was normal.                           - Non-bleeding internal hemorrhoids. Recommendation:           - Patient has a contact number available for                            emergencies. The signs and symptoms of potential                            delayed complications were discussed with the                            patient. Return to normal activities tomorrow.                            Written discharge instructions were provided to the                            patient.                           - Resume previous diet.                           - Continue present medications.                           - Await pathology results.                           - Repeat colonoscopy (date not yet determined) for                            surveillance based on pathology results. Ott Zimmerle E. Candis Schatz, MD 02/07/2022 12:05:04 PM This report has been signed electronically.

## 2022-02-07 NOTE — Progress Notes (Signed)
Called to room to assist during endoscopic procedure.  Patient ID and intended procedure confirmed with present staff. Received instructions for my participation in the procedure from the performing physician.  

## 2022-02-07 NOTE — Progress Notes (Signed)
PT taken to PACU. Monitors in place. VSS. Report given to RN. 

## 2022-02-08 ENCOUNTER — Telehealth: Payer: Self-pay

## 2022-02-08 NOTE — Telephone Encounter (Signed)
No answer on follow up call. 

## 2022-02-09 ENCOUNTER — Encounter: Payer: Self-pay | Admitting: Gastroenterology

## 2022-04-09 NOTE — Progress Notes (Deleted)
PATIENT: Gregory Spence DOB: 07/02/1955  REASON FOR VISIT: follow up HISTORY FROM: patient  No chief complaint on file.    HISTORY OF PRESENT ILLNESS:  04/09/22 ALL: [name] returns for follow up for seizures. He continues carbamazepine '500mg'$  BID.   04/10/2021 ALL: [name] returns for follow up for seizures. He continues carbamazepine '500mg'$  BID. He is tolerating well and without obvious adverse effects. He denies seizure activity since last being seen. He is living with his twin brother Gregory Spence. His sister recently moved back to [place]. He is able to manage his medications. He does not drive. He is here with his friend, today.   He was seen in the ER 01/26/2021 following a fall in the setting of weakness from diarrhea x 1 week. No major injuries and workup was unremarkable. Na was 133. He has an appt with Dr Luciana Axe 10/24 for follow up and blood work. He reports feeling much better and diarrhea is resolved.   10/01/2019 ALL:  Gregory Spence is a 66 y.o. male here today for follow up for seizures. He continues carbamazepine '500mg'$  twice daily. He is tolerating medication well with no obvious adverse effects. He denies seizure activity. He is seen yearly by PCP. No longer seeing hematology. He has received his first of two vaccines and anticipates getting the second on 4/19. He is living with is sister. He is feeling well today.    HISTORY: (copied from [name] note on 08/11/2018)  UPDATE (08/11/18, VRP): Since last visit, doing well. Symptoms are stable. No alleviating or aggravating factors. Tolerating CBZ. No seizures.   UPDATE (07/29/17, CM): 66 year old male returns for yearly follow-up. He reestablished care here with  [name] 09/25/2013. He had a previous patient of ours with Dr. [name] who has left the practice. 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 Last seizure event occurred  September 1990. He also has a congenital left hemiparesis. Denies any daytime drowsiness headaches visual disturbance no falls. Denies missing any doses of his medications. He returns for reevaluation. Mother passed away in 2014/06/30. She was primary caretaker. CBC  followed through oncology for his anemia. He needs liver function testing and refills. He has no new complaints.  He reports appetite is good.    PRIOR HPI (09/25/13, VRP): 66 year old male with history of mild mental retardation, static encephalopathy, mild congential left hemiparesis, seizure disorder, here to establish again for seizure disorder management.   Patient takes carbamazepine 200 mg tablets, 2.5 tablets twice a day. Patient has not had a seizure in over 20 years (last seizure 1990?). Otherwise patient is doing well. He continues to work for [employer].   First seizure of life around age 41 years old, with generalized convulsive seizures. No warning sign for seizures. Patient was initially on Dilantin and then transition to carbamazepine. No seizure since starting carbamazepine.   REVIEW OF SYSTEMS: Out of a complete 14 system review of symptoms, the patient complains only of the following symptoms, none and all other reviewed systems are negative.  ALLERGIES: No Known Allergies  HOME MEDICATIONS: Outpatient Medications Prior to Visit  Medication Sig Dispense Refill   atenolol (TENORMIN) 25 MG tablet 25 mg daily.      atorvastatin (LIPITOR) 80 MG tablet TAKE 1 TABLET BY MOUTH EVERY DAY NEED APPT FOR MORE REFILLS  0   carbamazepine (TEGRETOL) 200 MG tablet Take 2.5 tablets (500 mg total) by  mouth 2 (two) times daily. 450 tablet 3   ferrous sulfate 325 (65 FE) MG tablet Take 325 mg by mouth every Monday, Wednesday, and Friday.     folic acid (FOLVITE) 737 MCG tablet Take 400 mcg by mouth. Takes one tablet on Tues and Sat.     Probiotic Product ([employer]S PROBIOTIC) CAPS Take 1 tablet by mouth daily.     No  facility-administered medications prior to visit.    PAST MEDICAL HISTORY: Past Medical History:  Diagnosis Date   Anemia    Hypertension    Seizures ([place])     PAST SURGICAL HISTORY: No past surgical history on file.  FAMILY HISTORY: Family History  Problem Relation Age of Onset   Heart disease Mother    Diabetes Mother    Other Mother        infection   Colon cancer Sister    Colon cancer Brother    Colon polyps Neg Hx    Esophageal cancer Neg Hx    Stomach cancer Neg Hx    Rectal cancer Neg Hx     SOCIAL HISTORY: Social History   Socioeconomic History   Marital status: Single    Spouse name: Not on file   Number of children: 0   Years of education: 12th   Highest education level: Not on file  Occupational History    Employer: [employer].  Tobacco Use   Smoking status: Never   Smokeless tobacco: Never  Vaping Use   Vaping Use: Never used  Substance and Sexual Activity   Alcohol use: No   Drug use: No   Sexual activity: Not on file  Other Topics Concern   Not on file  Social History Narrative   Patient lives at home with father, twin brother.    Caffeine Use: 2 sodas daily   Never used EtOH, tobacco or illicit drugs.   Attended special education classes; graduated from 12th grade.   Social Determinants of Health   Financial Resource Strain: Not on file  Food Insecurity: Not on file  Transportation Needs: Not on file  Physical Activity: Not on file  Stress: Not on file  Social Connections: Not on file  Intimate Partner Violence: Not on file      PHYSICAL EXAM  There were no vitals filed for this visit.   There is no height or weight on file to calculate BMI.  Generalized: Well developed, in no acute distress  Cardiology: normal rate and rhythm, no murmur noted Respiratory: clear to auscultation bilaterally  Neurological examination  Mentation: Alert, slow responses, Mild MR. Follows all commands speech and language  fluent Cranial nerve II-XII: Pupils were equal round reactive to light. Extraocular movements were full, visual field were full on confrontational test. Facial sensation and strength were normal.  Head turning and shoulder shrug  were normal and symmetric. Motor: The motor testing reveals 5 over 5 strength of all 4 extremities. Good symmetric motor tone is noted throughout.  Sensory: Sensory testing is intact to soft touch on all 4 extremities. No evidence of extinction is noted.  Coordination: Cerebellar testing reveals good finger-nose-finger and heel-to-shin bilaterally.  Gait and station: Gait is narrow but stable.    DIAGNOSTIC DATA (LABS, IMAGING, TESTING) - I reviewed patient records, labs, notes, testing and imaging myself where available.      No data to display           Lab Results  Component Value Date   WBC 6.4  01/26/2021   HGB 13.1 01/26/2021   HCT 40.2 01/26/2021   MCV 89.1 01/26/2021   PLT 310 01/26/2021      Component Value Date/Time   NA 133 (L) 01/26/2021 2026   NA 137 10/01/2019 0814   NA 140 12/17/2013 1026   K 4.7 01/26/2021 2026   K 4.1 12/17/2013 1026   CL 101 01/26/2021 2026   CO2 22 01/26/2021 2026   CO2 27 12/17/2013 1026   GLUCOSE 109 (H) 01/26/2021 2026   GLUCOSE 91 12/17/2013 1026   BUN 10 01/26/2021 2026   BUN 7 (L) 10/01/2019 0814   BUN 6.8 (L) 12/17/2013 1026   CREATININE 1.17 01/26/2021 2026   CREATININE 1.0 12/17/2013 1026   CALCIUM 9.6 01/26/2021 2026   CALCIUM 9.3 12/17/2013 1026   PROT 7.9 01/26/2021 2026   PROT 7.3 10/01/2019 0814   PROT 7.6 12/17/2013 1026   ALBUMIN 4.1 01/26/2021 2026   ALBUMIN 4.3 10/01/2019 0814   ALBUMIN 3.9 12/17/2013 1026   AST 31 01/26/2021 2026   AST 25 12/17/2013 1026   ALT 23 01/26/2021 2026   ALT 20 12/17/2013 1026   ALKPHOS 82 01/26/2021 2026   ALKPHOS 83 12/17/2013 1026   BILITOT 0.5 01/26/2021 2026   BILITOT <0.2 10/01/2019 0814   BILITOT 0.26 12/17/2013 1026   GFRNONAA >60 01/26/2021  2026   GFRAA 96 10/01/2019 0814   No results found for: "CHOL", "HDL", "LDLCALC", "LDLDIRECT", "TRIG", "CHOLHDL" No results found for: "HGBA1C" Lab Results  Component Value Date   QRFXJOIT25 498 12/17/2013   No results found for: "TSH"     ASSESSMENT AND PLAN 66 y.o. year old male  has a past medical history of Anemia, Hypertension, and Seizures ([place]). here with   No diagnosis found.    Fernado is doing well. He is tolerating carbamazepine '500mg'$  twice daily with no adverse effects. We will continue current treatment. We have discussed updating labs, today. He has an appt with his PCP on 10/24 and requesting that labs be drawn with Dr Merilyn Baba office. I will place order in Epic for carbamazepine level to be collected. We will refill AED and he will request all other refills through PCP.  He was encouraged to stay well hydrated and work on healthy lifestyle habits with well balanced diet and regular exercise. He will follow up with me in 1 year, sooner if needed.    No orders of the defined types were placed in this encounter.     No orders of the defined types were placed in this encounter.      Debbora Presto, FNP-C 04/09/2022, 1:49 PM [facility] [address] [place],  [address] [phone]

## 2022-04-10 ENCOUNTER — Encounter: Payer: Self-pay | Admitting: Family Medicine

## 2022-04-10 ENCOUNTER — Ambulatory Visit: Payer: Medicare Other | Admitting: Family Medicine

## 2022-04-10 DIAGNOSIS — G40909 Epilepsy, unspecified, not intractable, without status epilepticus: Secondary | ICD-10-CM

## 2022-05-16 ENCOUNTER — Other Ambulatory Visit: Payer: Self-pay

## 2022-05-16 MED ORDER — CARBAMAZEPINE 200 MG PO TABS: 500.0000 mg | ORAL_TABLET | Freq: Two times a day (BID) | ORAL | 0 refills | Status: DC

## 2022-07-16 ENCOUNTER — Other Ambulatory Visit: Payer: Self-pay | Admitting: Family Medicine

## 2023-03-10 IMAGING — CT CT CERVICAL SPINE W/O CM
3 of 4 series · 12 of 33 positions shown, 14 images · non-contrast
Comparison: None.

CLINICAL DATA: Head trauma, minor (Age >= 65y); Neck trauma (Age >=
65y). Generalized weakness with multiple falls.

EXAM:
CT HEAD WITHOUT CONTRAST
CT CERVICAL SPINE WITHOUT CONTRAST
TECHNIQUE: Multidetector CT imaging of the head and cervical spine was
performed following the standard protocol without intravenous
contrast. Multiplanar CT image reconstructions of the cervical spine
were also generated.

[Series 4: c_spine 2.0 st · axial · 0.29mm/px · z∈[-691,-555]mm · 4 of 103 slices shown, 5 images]
[im 18/103  soft-tissue]
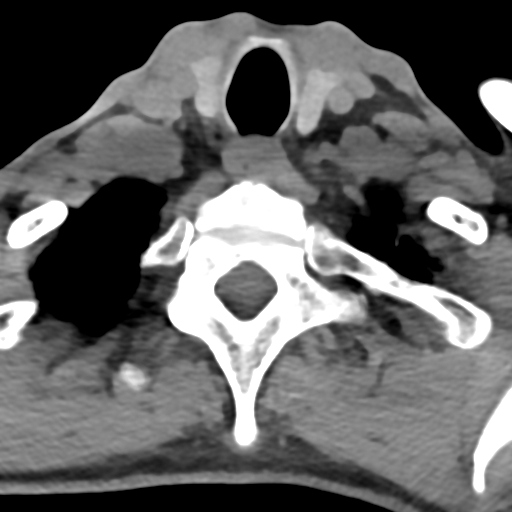
[im 18/103  bone]
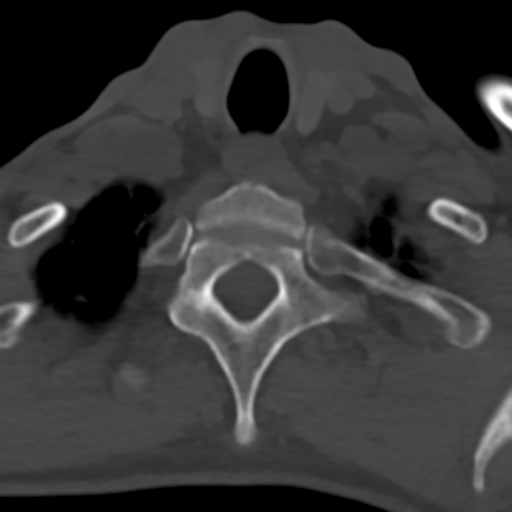
[im 35/103  bone]
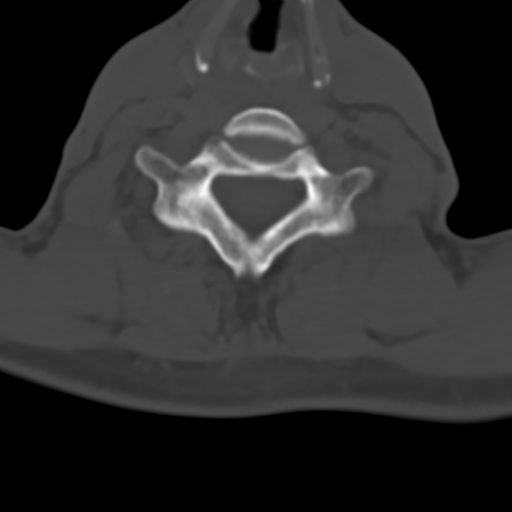
[im 69/103  bone]
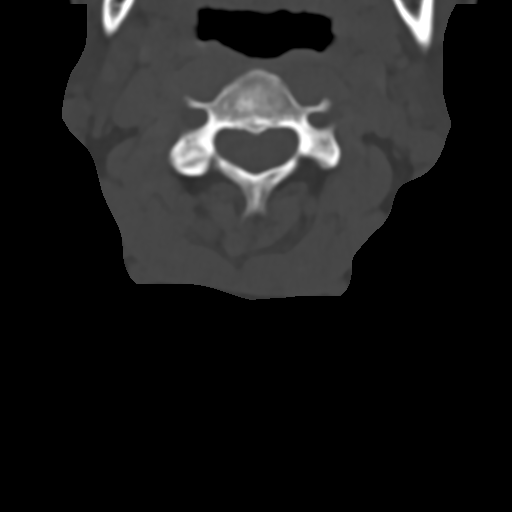
[im 86/103  bone]
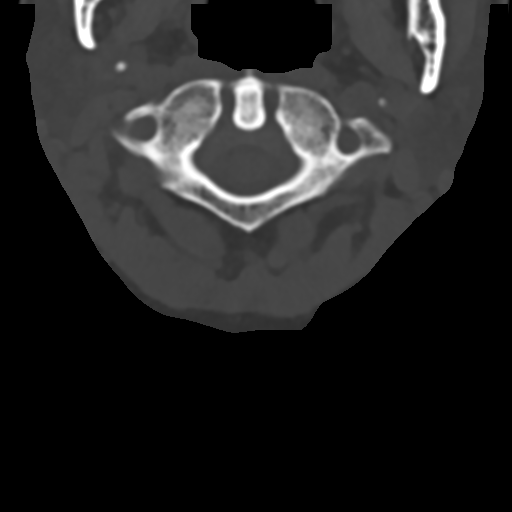

[Series 6: c_spine 2.0 sag bone · sagittal · 0.30mm/px · 5 of 61 slices shown, 6 images]
[im 21/61  bone]
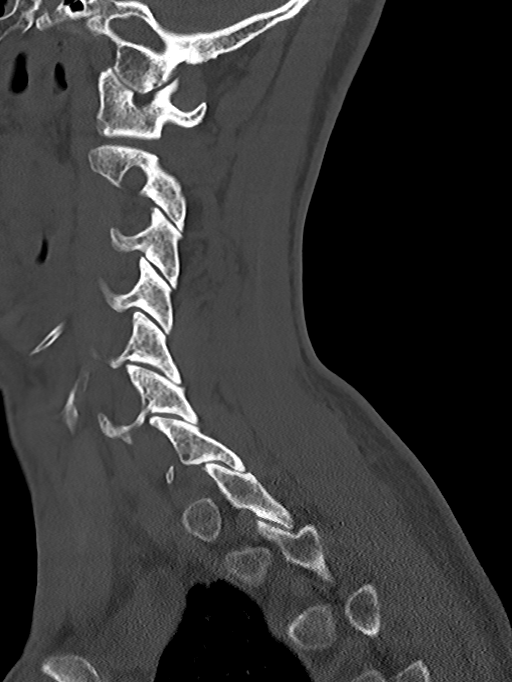
[im 26/61  bone]
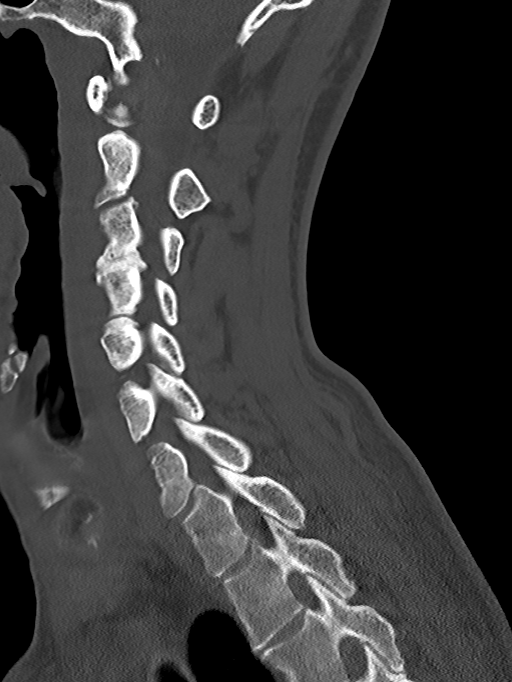
[im 31/61  soft-tissue]
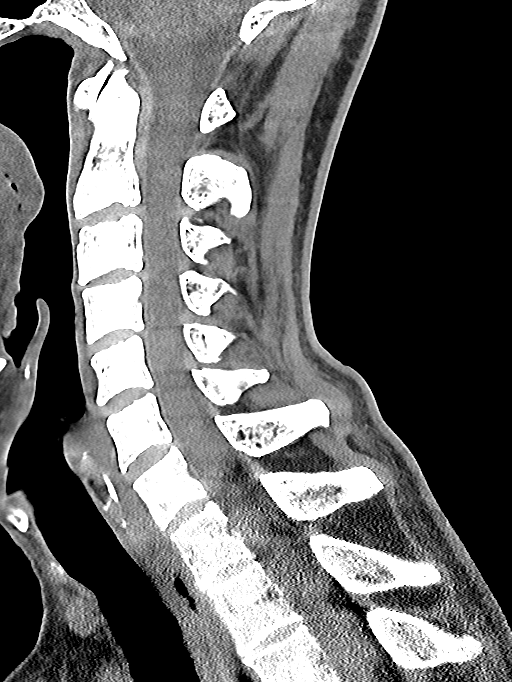
[im 31/61  bone]
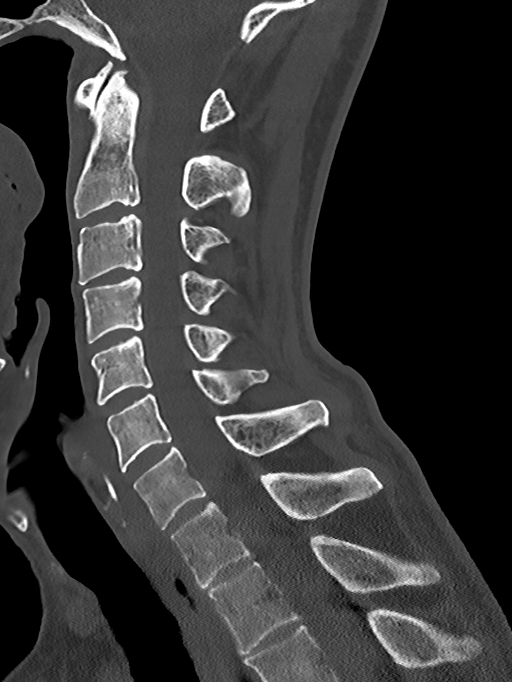
[im 36/61  bone]
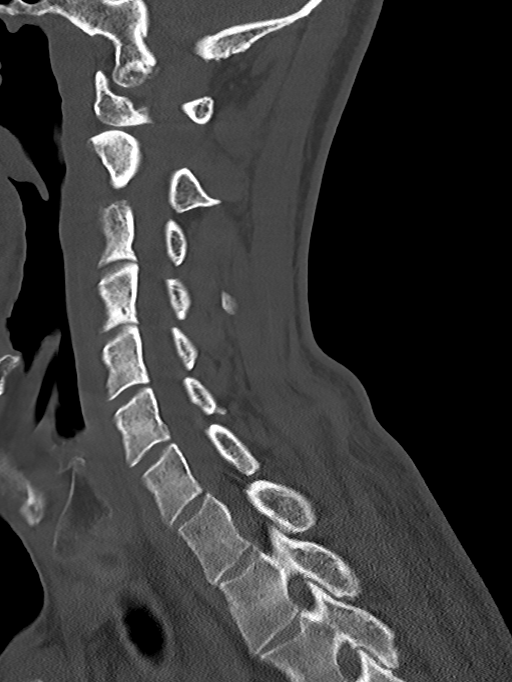
[im 41/61  bone]
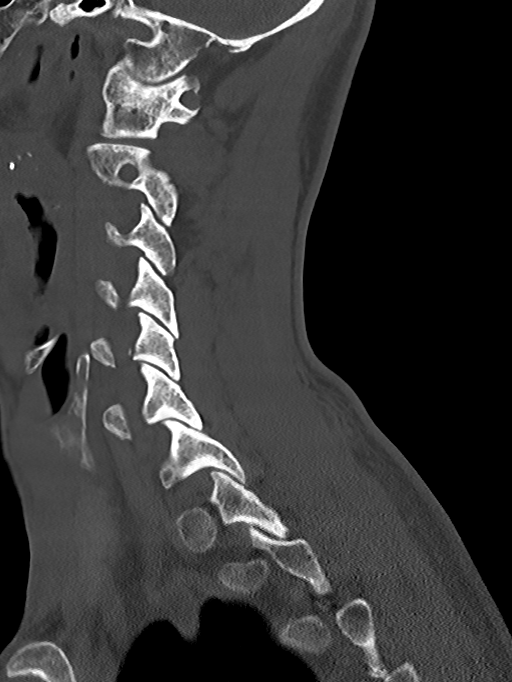

[Series 7: c_spine 2.0 cor bone · coronal · 0.24mm/px · 3 of 77 slices shown]
[im 16/77  bone]
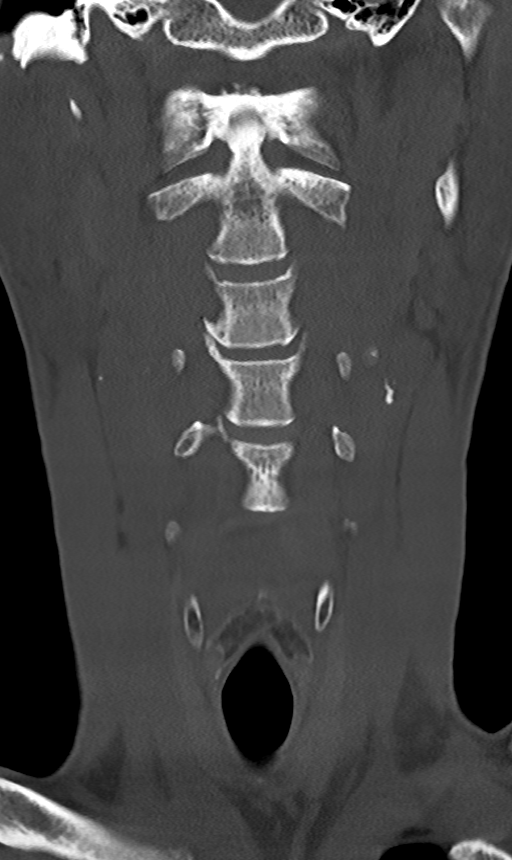
[im 31/77  bone]
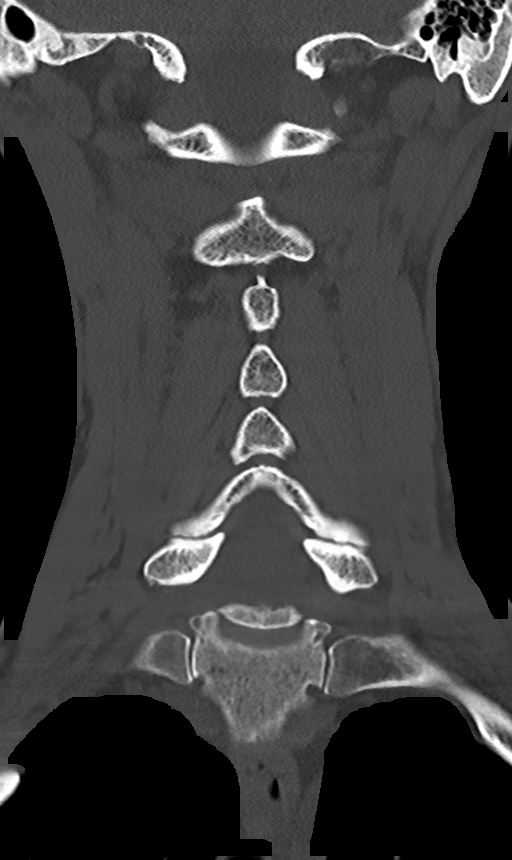
[im 46/77  bone]
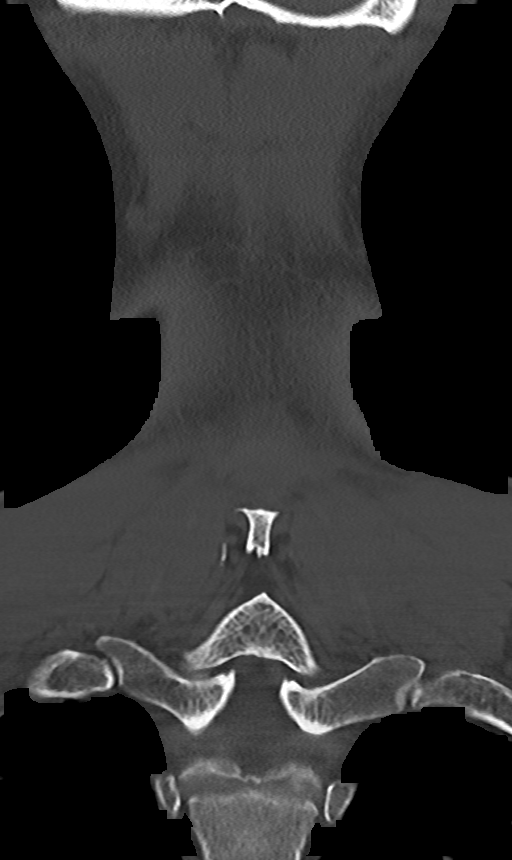

[12 of 33 positions shown; findings below may reference images not displayed]

FINDINGS: CT HEAD FINDINGS

Brain: Normal anatomic configuration. Parenchymal volume loss is
commensurate with the patient's age. Mild periventricular white
matter changes are present likely reflecting the sequela of small
vessel ischemia. No abnormal intra or extra-axial mass lesion or
fluid collection. No abnormal mass effect or midline shift. No
evidence of acute intracranial hemorrhage or infarct. Ventricular
size is normal. Cerebellum unremarkable.

Vascular: No asymmetric hyperdense vasculature at the skull base.

Skull: Intact

Sinuses/Orbits: Mild mucosal thickening and mucous retention cysts
within the visualized maxillary sinuses. No air-fluid levels.
Remaining paranasal sinuses are clear. Orbits are unremarkable.

Other: Mastoid air cells and middle ear cavities are clear.

CT CERVICAL SPINE FINDINGS

Alignment: Normal.

Skull base and vertebrae: No acute fracture. No primary bone lesion
or focal pathologic process.

Soft tissues and spinal canal: Choose

Disc levels: Mild intervertebral disc space narrowing and endplate
remodeling at C3-4 in keeping with changes of mild degenerative disc
disease. Remaining intervertebral disc heights are preserved.
Vertebral body heights are preserved. The prevertebral soft tissues
are not thickened on sagittal reformats. Review of the axial images
demonstrates mild right neuroforaminal narrowing at C3-4 and C4-5
secondary to uncovertebral arthrosis. Remaining neural foramina are
widely patent. Spinal canal is widely patent.

Upper chest: Unremarkable

Other: None
IMPRESSION: No acute intracranial abnormality.  No calvarial fracture.

No acute fracture or listhesis of the cervical spine.

## 2023-04-16 NOTE — Patient Instructions (Signed)
Below is our plan:  We will continue carbamazepine 500mg  (2.5 tablets) twice daily.   Please make sure you are consistent with timing of seizure medication. I recommend annual visit with primary care provider (PCP) for complete physical and routine blood work. I recommend daily intake of vitamin D (400-800iu) and calcium (800-1000mg ) for bone health. Discuss Dexa screening with PCP.   According to [place] law, you can not drive unless you are seizure / syncope free for at least 6 months and under physician's care.  Please maintain precautions. Do not participate in activities where a loss of awareness could harm you or someone else. No swimming alone, no tub bathing, no hot tubs, no driving, no operating motorized vehicles (cars, ATVs, motocycles, etc), lawnmowers, power tools or firearms. No standing at heights, such as rooftops, ladders or stairs. Avoid hot objects such as stoves, heaters, open fires. Wear a helmet when riding a bicycle, scooter, skateboard, etc. and avoid areas of traffic. Set your water heater to 120 degrees or less.  SUDEP is the sudden, unexpected death of someone with epilepsy, who was otherwise healthy. In SUDEP cases, no other cause of death is found when an autopsy is done. Each year, more than 1 in 1,000 people with epilepsy die from SUDEP. This is the leading cause of death in people with uncontrolled seizures. Until further answers are available, the best way to prevent SUDEP is to lower your risk by controlling seizures. Research has found that people with all types of epilepsy that experience convulsive seizures can be at risk.  Please make sure you are staying well hydrated. I recommend 50-60 ounces daily. Well balanced diet and regular exercise encouraged. Consistent sleep schedule with 6-8 hours recommended.   Please continue follow up with care team as directed.   Follow up with me in 1 year   You may receive a survey regarding today's visit. I encourage you to leave  honest feed back as I do use this information to improve patient care. Thank you for seeing me today!

## 2023-04-16 NOTE — Progress Notes (Unsigned)
PATIENT: Gregory Spence DOB: 11/21/1955  REASON FOR VISIT: follow up HISTORY FROM: patient  No chief complaint on file.     HISTORY OF PRESENT ILLNESS:  04/16/23 ALL: Gregory Spence returns for follow up for seizures. He was last seen 03/2021 and doing well on carbamazepine 500mg  BID. Per Epic review, he was seen 04/02/2023 by Romilda Garret, NP for a annual home visit and [name] reported not taking carbamezapine. PCP sent referral back to our office.   04/10/2021 ALL: Gregory Spence returns for follow up for seizures. He continues carbamazepine 500mg  BID. He is tolerating well and without obvious adverse effects. He denies seizure activity since last being seen. He is living with his twin brother Gregory Spence. His sister recently moved back to [place]. He is able to manage his medications. He does not drive. He is here with his friend, today.   He was seen in the ER 01/26/2021 following a fall in the setting of weakness from diarrhea x 1 week. No major injuries and workup was unremarkable. Na was 133. He has an appt with Gregory Spence 10/24 for follow up and blood work. He reports feeling much better and diarrhea is resolved.   10/01/2019 ALL:  Gregory Spence is a 67 y.o. male here today for follow up for seizures. He continues carbamazepine 500mg  twice daily. He is tolerating medication well with no obvious adverse effects. He denies seizure activity. He is seen yearly by PCP. No longer seeing hematology. He has received his first of two vaccines and anticipates getting the second on 4/19. He is living with is sister. He is feeling well today.    HISTORY: (copied from Gregory Richrd Spence note on 08/11/2018)  UPDATE (08/11/18, VRP): Since last visit, doing well. Symptoms are stable. No alleviating or aggravating factors. Tolerating CBZ. No seizures.   UPDATE (07/29/17, CM): 67 year old male returns for yearly follow-up. He reestablished care here with  Gregory. Marjory Spence 09/25/2013. He had a previous patient of ours with Gregory. Sharene Spence  who has left the practice. 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 Last seizure event occurred September 1990. He also has a congenital left hemiparesis. Denies any daytime drowsiness headaches visual disturbance no falls. Denies missing any doses of his medications. He returns for reevaluation. Mother passed away in July 06, 2014. She was primary caretaker. CBC  followed through oncology for his anemia. He needs liver function testing and refills. He has no new complaints.  He reports appetite is good.    PRIOR HPI (09/25/13, VRP): 67 year old male with history of mild mental retardation, static encephalopathy, mild congential left hemiparesis, seizure disorder, here to establish again for seizure disorder management.   Patient takes carbamazepine 200 mg tablets, 2.5 tablets twice a day. Patient has not had a seizure in over 20 years (last seizure 1990?). Otherwise patient is doing well. 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   First seizure of life around age 66 years old, with generalized convulsive seizures. No warning sign for seizures. Patient was initially on Dilantin and then transition to carbamazepine. No seizure since starting carbamazepine.   REVIEW OF SYSTEMS: Out of a complete 14 system review of symptoms, the patient complains only of the following symptoms, none and all other reviewed systems are negative.  ALLERGIES: No Known Allergies  HOME MEDICATIONS: Outpatient Medications Prior to Visit  Medication Sig Dispense Refill   atenolol (TENORMIN) 25 MG tablet 25 mg daily.  atorvastatin (LIPITOR) 80 MG tablet TAKE 1 TABLET BY MOUTH EVERY DAY NEED APPT FOR MORE REFILLS  0   carbamazepine (TEGRETOL) 200 MG tablet Take 2.5 tablets (500 mg total) by mouth 2 (two) times daily. 300 tablet 0   ferrous sulfate 325 (65 FE) MG tablet Take 325 mg by mouth every Monday, Wednesday, and  Friday.     folic acid (FOLVITE) 400 MCG tablet Take 400 mcg by mouth. Takes one tablet on Tues and Sat.     Probiotic Product ([employer] PROBIOTIC) CAPS Take 1 tablet by mouth daily.     No facility-administered medications prior to visit.    PAST MEDICAL HISTORY: Past Medical History:  Diagnosis Date   Anemia    Hypertension    Seizures (HCC)     PAST SURGICAL HISTORY: No past surgical history on file.  FAMILY HISTORY: Family History  Problem Relation Age of Onset   Heart disease Mother    Diabetes Mother    Other Mother        infection   Colon cancer Sister    Colon cancer Brother    Colon polyps Neg Hx    Esophageal cancer Neg Hx    Stomach cancer Neg Hx    Rectal cancer Neg Hx     SOCIAL HISTORY: Social History   Socioeconomic History   Marital status: Single    Spouse name: Not on file   Number of children: 0   Years of education: 12th   Highest education level: Not on file  Occupational History    Employer: [employer]  Tobacco Use   Smoking status: Never   Smokeless tobacco: Never  Vaping Use   Vaping status: Never Used  Substance and Sexual Activity   Alcohol use: No   Drug use: No   Sexual activity: Not on file  Other Topics Concern   Not on file  Social History Narrative   Patient lives at home with father, twin brother.    Caffeine Use: 2 sodas daily   Never used EtOH, tobacco or illicit drugs.   Attended special education classes; graduated from 12th grade.   Social Determinants of Health   Financial Resource Strain: Not on file  Food Insecurity: Not on file  Transportation Needs: Not on file  Physical Activity: Not on file  Stress: Not on file  Social Connections: Not on file  Intimate Partner Violence: Not on file      PHYSICAL EXAM  There were no vitals filed for this visit.   There is no height or weight on file to calculate BMI.  Generalized: Well developed, in no acute distress  Cardiology: normal rate and  rhythm, no murmur noted Respiratory: clear to auscultation bilaterally  Neurological examination  Mentation: Alert, slow responses, Mild MR. Follows all commands speech and language fluent Cranial nerve II-XII: Pupils were equal round reactive to light. Extraocular movements were full, visual field were full on confrontational test. Facial sensation and strength were normal.  Head turning and shoulder shrug  were normal and symmetric. Motor: The motor testing reveals 5 over 5 strength of all 4 extremities. Good symmetric motor tone is noted throughout.  Sensory: Sensory testing is intact to soft touch on all 4 extremities. No evidence of extinction is noted.  Coordination: Cerebellar testing reveals good finger-nose-finger and heel-to-shin bilaterally.  Gait and station: Gait is narrow but stable.    DIAGNOSTIC DATA (LABS, IMAGING, TESTING) - I reviewed patient records, labs, notes, testing and  imaging myself where available.      No data to display           Lab Results  Component Value Date   WBC 6.4 01/26/2021   HGB 13.1 01/26/2021   HCT 40.2 01/26/2021   MCV 89.1 01/26/2021   PLT 310 01/26/2021      Component Value Date/Time   NA 133 (L) 01/26/2021 2026   NA 137 10/01/2019 0814   NA 140 12/17/2013 1026   K 4.7 01/26/2021 2026   K 4.1 12/17/2013 1026   CL 101 01/26/2021 2026   CO2 22 01/26/2021 2026   CO2 27 12/17/2013 1026   GLUCOSE 109 (H) 01/26/2021 2026   GLUCOSE 91 12/17/2013 1026   BUN 10 01/26/2021 2026   BUN 7 (L) 10/01/2019 0814   BUN 6.8 (L) 12/17/2013 1026   CREATININE 1.17 01/26/2021 2026   CREATININE 1.0 12/17/2013 1026   CALCIUM 9.6 01/26/2021 2026   CALCIUM 9.3 12/17/2013 1026   PROT 7.9 01/26/2021 2026   PROT 7.3 10/01/2019 0814   PROT 7.6 12/17/2013 1026   ALBUMIN 4.1 01/26/2021 2026   ALBUMIN 4.3 10/01/2019 0814   ALBUMIN 3.9 12/17/2013 1026   AST 31 01/26/2021 2026   AST 25 12/17/2013 1026   ALT 23 01/26/2021 2026   ALT 20 12/17/2013 1026    ALKPHOS 82 01/26/2021 2026   ALKPHOS 83 12/17/2013 1026   BILITOT 0.5 01/26/2021 2026   BILITOT <0.2 10/01/2019 0814   BILITOT 0.26 12/17/2013 1026   GFRNONAA >60 01/26/2021 2026   GFRAA 96 10/01/2019 0814   No results found for: "CHOL", "HDL", "LDLCALC", "LDLDIRECT", "TRIG", "CHOLHDL" No results found for: "HGBA1C" Lab Results  Component Value Date   VITAMINB12 554 12/17/2013   No results found for: "TSH"     ASSESSMENT AND PLAN 67 y.o. year old male  has a past medical history of Anemia, Hypertension, and Seizures (HCC). here with   No diagnosis found.    Rodrico is doing well. He is tolerating carbamazepine 500mg  twice daily with no adverse effects. We will continue current treatment. We have discussed updating labs, today. He has an appt with his PCP on 10/24 and requesting that labs be drawn with [facility] office. I will place order in Epic for carbamazepine level to be collected. We will refill AED and he will request all other refills through PCP.  He was encouraged to stay well hydrated and work on healthy lifestyle habits with well balanced diet and regular exercise. He will follow up with me in 1 year, sooner if needed.    No orders of the defined types were placed in this encounter.     No orders of the defined types were placed in this encounter.      Christena Deem 04/16/2023, 12:27 PM [facility] [address], [address] [place], [place] [address] [phone]

## 2023-04-17 ENCOUNTER — Ambulatory Visit (INDEPENDENT_AMBULATORY_CARE_PROVIDER_SITE_OTHER): Payer: 59 | Admitting: Family Medicine

## 2023-04-17 ENCOUNTER — Encounter: Payer: Self-pay | Admitting: Family Medicine

## 2023-04-17 VITALS — BP 152/74 | HR 85 | Ht 74.0 in | Wt 159.5 lb

## 2023-04-17 DIAGNOSIS — G40909 Epilepsy, unspecified, not intractable, without status epilepticus: Secondary | ICD-10-CM | POA: Diagnosis not present

## 2023-04-17 MED ORDER — CARBAMAZEPINE 200 MG PO TABS: 500.0000 mg | ORAL_TABLET | Freq: Two times a day (BID) | ORAL | 3 refills | Status: DC

## 2023-04-18 ENCOUNTER — Telehealth: Payer: Self-pay

## 2023-04-18 LAB — CBC WITH DIFFERENTIAL/PLATELET
Basophils Absolute: 0 10*3/uL (ref 0.0–0.2)
Basos: 1 %
EOS (ABSOLUTE): 0.2 10*3/uL (ref 0.0–0.4)
Eos: 4 %
Hematocrit: 39.7 % (ref 37.5–51.0)
Hemoglobin: 12.9 g/dL — ABNORMAL LOW (ref 13.0–17.7)
Immature Grans (Abs): 0 10*3/uL (ref 0.0–0.1)
Immature Granulocytes: 0 %
Lymphocytes Absolute: 1.1 10*3/uL (ref 0.7–3.1)
Lymphs: 28 %
MCH: 29.9 pg (ref 26.6–33.0)
MCHC: 32.5 g/dL (ref 31.5–35.7)
MCV: 92 fL (ref 79–97)
Monocytes Absolute: 0.3 10*3/uL (ref 0.1–0.9)
Monocytes: 7 %
Neutrophils Absolute: 2.5 10*3/uL (ref 1.4–7.0)
Neutrophils: 60 %
Platelets: 296 10*3/uL (ref 150–450)
RBC: 4.32 x10E6/uL (ref 4.14–5.80)
RDW: 12.1 % (ref 11.6–15.4)
WBC: 4.1 10*3/uL (ref 3.4–10.8)

## 2023-04-18 LAB — COMPREHENSIVE METABOLIC PANEL
ALT: 49 [IU]/L — ABNORMAL HIGH (ref 0–44)
AST: 49 [IU]/L — ABNORMAL HIGH (ref 0–40)
Albumin: 4.5 g/dL (ref 3.9–4.9)
Alkaline Phosphatase: 88 [IU]/L (ref 44–121)
BUN/Creatinine Ratio: 16 (ref 10–24)
BUN: 15 mg/dL (ref 8–27)
Bilirubin Total: 1.1 mg/dL (ref 0.0–1.2)
CO2: 24 mmol/L (ref 20–29)
Calcium: 9.5 mg/dL (ref 8.6–10.2)
Chloride: 102 mmol/L (ref 96–106)
Creatinine, Ser: 0.96 mg/dL (ref 0.76–1.27)
Globulin, Total: 2.8 g/dL (ref 1.5–4.5)
Glucose: 103 mg/dL — ABNORMAL HIGH (ref 70–99)
Potassium: 3.9 mmol/L (ref 3.5–5.2)
Sodium: 142 mmol/L (ref 134–144)
Total Protein: 7.3 g/dL (ref 6.0–8.5)
eGFR: 86 mL/min/{1.73_m2} (ref >59)

## 2023-04-18 LAB — CARBAMAZEPINE LEVEL, TOTAL: Carbamazepine (Tegretol), S: <2 ug/mL — ABNORMAL LOW (ref 4.0–12.0)

## 2023-04-18 NOTE — Telephone Encounter (Signed)
Called brother (on DPR/ also came to Appointment) per Shawnie Dapper, NP, and told him pt's lab results "labs look fairly stable, however, his carbamazepine level was really low. This likely means he has not been taking his seizure medication. Please make sure he was able to fill the prescription and is taking as prescribed. Also, his liver enzymes were a touch elevated. I would like him to check in with his PCP about this and see if any additional workup needs to be done. " Pt's brother verbalized understanding.

## 2023-04-20 ENCOUNTER — Emergency Department (HOSPITAL_COMMUNITY): Payer: 59

## 2023-04-20 ENCOUNTER — Other Ambulatory Visit: Payer: Self-pay

## 2023-04-20 ENCOUNTER — Inpatient Hospital Stay (HOSPITAL_COMMUNITY)
Admission: EM | Admit: 2023-04-20 | Discharge: 2023-04-23 | DRG: 918 | Disposition: A | Discharge status: Home / Self Care | Payer: 59 | Attending: Internal Medicine | Admitting: Internal Medicine

## 2023-04-20 ENCOUNTER — Encounter (HOSPITAL_COMMUNITY): Payer: Self-pay | Admitting: Emergency Medicine

## 2023-04-20 DIAGNOSIS — T421X1A Poisoning by iminostilbenes, accidental (unintentional), initial encounter: Principal | ICD-10-CM | POA: Diagnosis present

## 2023-04-20 DIAGNOSIS — R7989 Other specified abnormal findings of blood chemistry: Secondary | ICD-10-CM | POA: Diagnosis present

## 2023-04-20 DIAGNOSIS — Z8249 Family history of ischemic heart disease and other diseases of the circulatory system: Secondary | ICD-10-CM

## 2023-04-20 DIAGNOSIS — I10 Essential (primary) hypertension: Secondary | ICD-10-CM | POA: Diagnosis present

## 2023-04-20 DIAGNOSIS — W19XXXA Unspecified fall, initial encounter: Secondary | ICD-10-CM | POA: Diagnosis present

## 2023-04-20 DIAGNOSIS — I951 Orthostatic hypotension: Principal | ICD-10-CM | POA: Diagnosis present

## 2023-04-20 DIAGNOSIS — Z833 Family history of diabetes mellitus: Secondary | ICD-10-CM

## 2023-04-20 DIAGNOSIS — Y92009 Unspecified place in unspecified non-institutional (private) residence as the place of occurrence of the external cause: Secondary | ICD-10-CM

## 2023-04-20 DIAGNOSIS — E7800 Pure hypercholesterolemia, unspecified: Secondary | ICD-10-CM | POA: Diagnosis present

## 2023-04-20 DIAGNOSIS — F70 Mild intellectual disabilities: Secondary | ICD-10-CM | POA: Diagnosis present

## 2023-04-20 DIAGNOSIS — Z800 Family history of malignant neoplasm of digestive organs: Secondary | ICD-10-CM

## 2023-04-20 DIAGNOSIS — G40909 Epilepsy, unspecified, not intractable, without status epilepticus: Secondary | ICD-10-CM

## 2023-04-20 DIAGNOSIS — S0990XA Unspecified injury of head, initial encounter: Secondary | ICD-10-CM | POA: Diagnosis present

## 2023-04-20 DIAGNOSIS — Z79899 Other long term (current) drug therapy: Secondary | ICD-10-CM

## 2023-04-20 DIAGNOSIS — E876 Hypokalemia: Secondary | ICD-10-CM | POA: Diagnosis present

## 2023-04-20 LAB — COMPREHENSIVE METABOLIC PANEL
ALT: 34 U/L (ref 0–44)
AST: 34 U/L (ref 15–41)
Albumin: 3.5 g/dL (ref 3.5–5.0)
Alkaline Phosphatase: 67 U/L (ref 38–126)
Anion gap: 11 (ref 5–15)
BUN: 7 mg/dL — ABNORMAL LOW (ref 8–23)
CO2: 26 mmol/L (ref 22–32)
Calcium: 8.9 mg/dL (ref 8.9–10.3)
Chloride: 103 mmol/L (ref 98–111)
Creatinine, Ser: 1 mg/dL (ref 0.61–1.24)
GFR, Estimated: >60 mL/min (ref >60)
Glucose, Bld: 142 mg/dL — ABNORMAL HIGH (ref 70–99)
Potassium: 3.4 mmol/L — ABNORMAL LOW (ref 3.5–5.1)
Sodium: 140 mmol/L (ref 135–145)
Total Bilirubin: 1.1 mg/dL (ref 0.3–1.2)
Total Protein: 6.9 g/dL (ref 6.5–8.1)

## 2023-04-20 LAB — CBC WITH DIFFERENTIAL/PLATELET
Abs Immature Granulocytes: 0.01 10*3/uL (ref 0.00–0.07)
Basophils Absolute: 0 10*3/uL (ref 0.0–0.1)
Basophils Relative: 1 %
Eosinophils Absolute: 0.1 10*3/uL (ref 0.0–0.5)
Eosinophils Relative: 1 %
HCT: 37 % — ABNORMAL LOW (ref 39.0–52.0)
Hemoglobin: 11.9 g/dL — ABNORMAL LOW (ref 13.0–17.0)
Immature Granulocytes: 0 %
Lymphocytes Relative: 17 %
Lymphs Abs: 1 10*3/uL (ref 0.7–4.0)
MCH: 29 pg (ref 26.0–34.0)
MCHC: 32.2 g/dL (ref 30.0–36.0)
MCV: 90 fL (ref 80.0–100.0)
Monocytes Absolute: 0.6 10*3/uL (ref 0.1–1.0)
Monocytes Relative: 11 %
Neutro Abs: 4.2 10*3/uL (ref 1.7–7.7)
Neutrophils Relative %: 70 %
Platelets: 168 10*3/uL (ref 150–400)
RBC: 4.11 MIL/uL — ABNORMAL LOW (ref 4.22–5.81)
RDW: 12.7 % (ref 11.5–15.5)
WBC: 6 10*3/uL (ref 4.0–10.5)
nRBC: 0 % (ref 0.0–0.2)

## 2023-04-20 LAB — TROPONIN I (HIGH SENSITIVITY): Troponin I (High Sensitivity): 32 ng/L — ABNORMAL HIGH (ref <18)

## 2023-04-20 LAB — CARBAMAZEPINE LEVEL, TOTAL: Carbamazepine Lvl: 19.3 ug/mL (ref 4.0–12.0)

## 2023-04-20 MED ORDER — POTASSIUM CHLORIDE 20 MEQ PO PACK
40.0000 meq | PACK | Freq: Once | ORAL | Status: AC
  Administered 2023-04-21: 40 meq via ORAL
  Filled 2023-04-20: qty 2

## 2023-04-20 MED ORDER — ACETAMINOPHEN 650 MG RE SUPP: 650.0000 mg | Freq: Four times a day (QID) | RECTAL | Status: DC | PRN

## 2023-04-20 MED ORDER — LACTATED RINGERS IV SOLN
INTRAVENOUS | Status: AC
Start: 2023-04-21 — End: 2023-04-21

## 2023-04-20 MED ORDER — SODIUM CHLORIDE 0.9% FLUSH
3.0000 mL | Freq: Two times a day (BID) | INTRAVENOUS | Status: DC
  Administered 2023-04-21 – 2023-04-23 (×5): 3 mL via INTRAVENOUS

## 2023-04-20 MED ORDER — ONDANSETRON HCL 4 MG/2ML IJ SOLN: 4.0000 mg | Freq: Four times a day (QID) | INTRAMUSCULAR | Status: DC | PRN

## 2023-04-20 MED ORDER — SODIUM CHLORIDE 0.9 % IV BOLUS
1000.0000 mL | Freq: Once | INTRAVENOUS | Status: AC
  Administered 2023-04-20: 1000 mL via INTRAVENOUS

## 2023-04-20 MED ORDER — SENNOSIDES-DOCUSATE SODIUM 8.6-50 MG PO TABS
1.0000 | ORAL_TABLET | Freq: Every evening | ORAL | Status: DC | PRN
Start: 2023-04-20 — End: 2023-04-23

## 2023-04-20 MED ORDER — ENOXAPARIN SODIUM 40 MG/0.4ML IJ SOSY
40.0000 mg | PREFILLED_SYRINGE | INTRAMUSCULAR | Status: DC
  Administered 2023-04-21 – 2023-04-22 (×2): 40 mg via SUBCUTANEOUS
  Filled 2023-04-20 (×2): qty 0.4

## 2023-04-20 MED ORDER — ONDANSETRON HCL 4 MG PO TABS: 4.0000 mg | ORAL_TABLET | Freq: Four times a day (QID) | ORAL | Status: DC | PRN

## 2023-04-20 MED ORDER — LEVETIRACETAM 500 MG PO TABS
500.0000 mg | ORAL_TABLET | Freq: Two times a day (BID) | ORAL | Status: DC
  Administered 2023-04-21 – 2023-04-23 (×5): 500 mg via ORAL
  Filled 2023-04-20 (×5): qty 1

## 2023-04-20 MED ORDER — ACETAMINOPHEN 325 MG PO TABS: 650.0000 mg | ORAL_TABLET | Freq: Four times a day (QID) | ORAL | Status: DC | PRN

## 2023-04-20 NOTE — H&P (Signed)
History and Physical    Gregory Spence WUJ:[id number] DOB: 02-27-1956 DOA: 04/20/2023  PCP: Verlon Au, MD  Patient coming from: Home  I have personally briefly reviewed patient's old medical records in [facility]  Chief Complaint: Fall at home  HPI: Gregory Spence is a 67 y.o. male with medical history significant for mild intellectual disability, seizure disorder, HTN, HLD who presented to the ED for evaluation after an unwitnessed fall at home with unresponsive episode.  History limited from patient due to intellectual disability otherwise supplemented by EDP, chart review, and his brothers at bedside.  Patient lives at home with his brother.  His brother heard patient fall and rushed to check in on him.  Patient was laying on the ground but awake and speaking.  Normally patient ambulates well on his own but he was unable to stand up due to weakness and unsteady gait.  Patient assumed that he might of had a seizure.  Patient was recently started back on his carbamazepine 500 mg BID 2 days ago after he had been off for a few weeks after running out.  Family states that patient's last known seizure was in the 91s.  ED Course  Labs/Imaging on admission: I have personally reviewed following labs and imaging studies.  Initial vitals showed BP 154/96, pulse 74, RR 18, temp 98.3 F, SpO2 100% on room air.  Orthostatic vitals attempted but unable to complete due to dizziness/unsteady when standing.  Labs showed carbamazepine level 19.3, sodium 140, potassium 3.4, bicarb 26, BUN 7, creatinine 1.00, serum glucose 142, LFTs within normal limits, WBC 6.0, hemoglobin 11.9, platelets 168,000, troponin 32.  TSH and cortisol levels ordered and pending.  CT head without contrast negative for acute intracranial abnormality.  CT cervical spine without contrast negative for acute fracture in the cervical spine.  Patient was given 2 L normal saline.  EDP discussed with on-call neurology  who recommended holding Tegretol and switching to Keppra 500 mg twice daily.  Symptoms likely from Tegretol toxicity.  The hospitalist service was consulted to admit for further evaluation and management.  Review of Systems: All systems reviewed and are negative except as documented in history of present illness above.   Past Medical History:  Diagnosis Date   Anemia    Hypertension    Seizures (HCC)     History reviewed. No pertinent surgical history.  Social History:  reports that he has never smoked. He has never used smokeless tobacco. He reports that he does not drink alcohol and does not use drugs.  No Known Allergies  Family History  Problem Relation Age of Onset   Heart disease Mother    Diabetes Mother    Other Mother        infection   Colon cancer Sister    Colon cancer Brother    Colon polyps Neg Hx    Esophageal cancer Neg Hx    Stomach cancer Neg Hx    Rectal cancer Neg Hx    Seizures Neg Hx      Prior to Admission medications   Medication Sig Start Date End Date Taking? Authorizing Provider  atenolol (TENORMIN) 25 MG tablet 25 mg daily.  07/27/11   [provider]  atorvastatin (LIPITOR) 80 MG tablet TAKE 1 TABLET BY MOUTH EVERY DAY NEED APPT FOR MORE REFILLS 06/03/15   [provider]  carbamazepine (TEGRETOL) 200 MG tablet Take 2.5 tablets (500 mg total) by mouth 2 (two) times daily. 04/17/23  Lomax, Amy, NP  ferrous sulfate 325 (65 FE) MG tablet Take 325 mg by mouth every Monday, Wednesday, and Friday.    [provider]  folic acid (FOLVITE) 400 MCG tablet Take 400 mcg by mouth. Takes one tablet on Tues and Sat.    [provider]  Probiotic Product ([employer] PROBIOTIC) CAPS Take 1 tablet by mouth daily. 07/10/18   [provider]    Physical Exam: Vitals:   04/20/23 2114 04/20/23 2200 04/20/23 2215 04/20/23 2230  BP: (!) 154/96 (!) 168/73 (!) 152/80 (!) 152/79  Pulse: 74 73 75 73  Resp: 18 16 16 16   Temp: 98.3  F (36.8 C)     TempSrc: Oral     SpO2: 100% 100% 100% 100%  Weight: 72.3 kg     Height: 6\' 2"  (1.88 m)      Constitutional: Resting in bed, NAD, calm, comfortable Eyes: EOMI, lids and conjunctivae normal ENMT: Mucous membranes are moist. Posterior pharynx clear of any exudate or lesions.Normal dentition.  Neck: normal, supple, no masses. Respiratory: clear to auscultation bilaterally, no wheezing, no crackles. Normal respiratory effort. No accessory muscle use.  Cardiovascular: Regular rate and rhythm, no murmurs / rubs / gallops. No extremity edema. 2+ pedal pulses. Abdomen: no tenderness, no masses palpated. No hepatosplenomegaly. Bowel sounds positive.  Musculoskeletal: no clubbing / cyanosis. No joint deformity upper and lower extremities. Good ROM, no contractures. Normal muscle tone.  Skin: no rashes, lesions, ulcers. No induration Neurologic: Sensation intact. Strength 5/5 in all 4.  Psychiatric: Alert and oriented x 3. Normal mood.   EKG: Personally reviewed. Sinus rhythm, rate 74, LVH, no acute ischemic changes, QTc 415.  No prior for comparison.  Assessment/Plan Principal Problem:   Carbamazepine toxicity, accidental or unintentional, initial encounter Active Problems:   Orthostasis   Hypokalemia   Elevated troponin   Seizure disorder [facility])   Essential hypertension   Hypercholesterolemia   Gregory Spence is a 67 y.o. male with medical history significant for mild intellectual disability, seizure disorder, HTN, HLD who is admitted with symptomatic carbamazepine toxicity.  Assessment and Plan: Seizure disorder with carbamazepine toxicity: Recently started back on carbamazepine 500 mg BID after he ran out for few weeks.  Carbamazepine level elevated at 19.3, likely explanation for orthostatic symptoms.  No obvious seizure activity witnessed by family at home. -Holding carbamazepine, recheck level in a.m. -EDP discussed with neurology, recommend switching to Keppra 500 mg  BID -Monitor on telemetry -Continue IV fluid hydration overnight -Seizure precautions  Orthostasis: Significant symptoms occurring when attempting to stand.  No recorded hypotensive blood pressure readings however at risk with carbamazepine toxicity. -IV fluid hydration as above -Fall precautions, PT/OT eval  Hypokalemia: Supplement orally.  Elevated troponin: Mild, no chest pain.  Repeat level.  Hypertension: Holding atenolol for now.  Hyperlipidemia: Hold atorvastatin for now, check CK level.   DVT prophylaxis: enoxaparin (LOVENOX) injection 40 mg Start: 04/21/23 2200 Code Status: Full code, discussed with family on admission Family Communication: 2 brothers at bedside on admission Disposition Plan: From home and likely return home pending clinical progress Consults called: EDP discussed with on-call neurology, not formally consulted Severity of Illness: The appropriate patient status for this patient is OBSERVATION. Observation status is judged to be reasonable and necessary in order to provide the required intensity of service to ensure the patient's safety. The patient's presenting symptoms, physical exam findings, and initial radiographic and laboratory data in the context of their medical condition is felt to place  them at decreased risk for further clinical deterioration. Furthermore, it is anticipated that the patient will be medically stable for discharge from the hospital within 2 midnights of admission.   Darreld Mclean MD [facility]  If 7PM-7AM, please contact night-coverage [email or web address]  04/21/2023, 12:04 AM

## 2023-04-20 NOTE — ED Triage Notes (Signed)
Pt BIB EMS from home called out for a fall, brother had brought him food then heard a fall and found pt unresponsive on the floor pt had one episode of vomiting. Upon FD arrival pt was lethargic, pt was alert to baseline upon EMS arrival. Hx of seizures, reports being off of his medications and restarting Tegretol 2 days ago. Denies witnessed seizure like activity.

## 2023-04-20 NOTE — ED Notes (Signed)
Pt very unsteady, unable to stand for remaining orthostatic vital signs.

## 2023-04-20 NOTE — ED Notes (Signed)
MD Silverio Lay made aware of Carbamazepine Lvl of 19.3

## 2023-04-20 NOTE — ED Notes (Signed)
ED TO INPATIENT HANDOFF REPORT  ED Nurse Name and Phone #: Morrie Sheldon RN [phone]  S Name/Age/Gender Gregory Spence 67 y.o. male Room/Bed: 032C/032C  Code Status   Code Status: Not on file  Home/SNF/Other Home Patient oriented to: self, place, time, and situation Is this baseline? Yes   Triage Complete: Triage complete  Chief Complaint Carbamazepine toxicity, accidental or unintentional, initial encounter [T42.1X1A]  Triage Note Pt BIB EMS from home called out for a fall, brother had brought him food then heard a fall and found pt unresponsive on the floor pt had one episode of vomiting. Upon FD arrival pt was lethargic, pt was alert to baseline upon EMS arrival. Hx of seizures, reports being off of his medications and restarting Tegretol 2 days ago. Denies witnessed seizure like activity.    Allergies No Known Allergies  Level of Care/Admitting Diagnosis ED Disposition     ED Disposition  Admit   Condition  --   Comment  Hospital Area: [facility] [[id number]]  Level of Care: Telemetry Medical [104]  May place patient in observation at [facility] or [facility] if equivalent level of care is available:: No  Covid Evaluation: Asymptomatic - no recent exposure (last 10 days) testing not required  Diagnosis: Carbamazepine toxicity, accidental or unintentional, initial encounter [[id number]]  Admitting Physician: Charlsie Quest [[id number]]  Attending Physician: Charlsie Quest [[id number]]          B Medical/Surgery History Past Medical History:  Diagnosis Date   Anemia    Hypertension    Seizures (HCC)    History reviewed. No pertinent surgical history.   A IV Location/Drains/Wounds Patient Lines/Drains/Airways Status     Active Line/Drains/Airways     Name Placement date Placement time Site Days   Peripheral IV 01/26/21 22 G Right Antecubital 01/26/21  2039  Antecubital  814   Peripheral IV 01/26/21 22 G Right Antecubital 01/26/21  2045  Antecubital   814   Peripheral IV 04/20/23 20 G Left Antecubital 04/20/23  2000  Antecubital  less than 1            Intake/Output Last 24 hours No intake or output data in the 24 hours ending 04/20/23 2341  Labs/Imaging Results for orders placed or performed during the hospital encounter of 04/20/23 (from the past 48 hour(s))  CBC with Differential     Status: Abnormal   Collection Time: 04/20/23  9:45 PM  Result Value Ref Range   WBC 6.0 4.0 - 10.5 K/uL   RBC 4.11 (L) 4.22 - 5.81 MIL/uL   Hemoglobin 11.9 (L) 13.0 - 17.0 g/dL   HCT 44.0 (L) 34.7 - 42.5 %   MCV 90.0 80.0 - 100.0 fL   MCH 29.0 26.0 - 34.0 pg   MCHC 32.2 30.0 - 36.0 g/dL   RDW 95.6 38.7 - 56.4 %   Platelets 168 150 - 400 K/uL    Comment: REPEATED TO VERIFY   nRBC 0.0 0.0 - 0.2 %   Neutrophils Relative % 70 %   Neutro Abs 4.2 1.7 - 7.7 K/uL   Lymphocytes Relative 17 %   Lymphs Abs 1.0 0.7 - 4.0 K/uL   Monocytes Relative 11 %   Monocytes Absolute 0.6 0.1 - 1.0 K/uL   Eosinophils Relative 1 %   Eosinophils Absolute 0.1 0.0 - 0.5 K/uL   Basophils Relative 1 %   Basophils Absolute 0.0 0.0 - 0.1 K/uL   Immature Granulocytes 0 %   Abs Immature  Granulocytes 0.01 0.00 - 0.07 K/uL    Comment: Performed at [facility], [address], [place], [place] [address]  Comprehensive metabolic panel     Status: Abnormal   Collection Time: 04/20/23  9:45 PM  Result Value Ref Range   Sodium 140 135 - 145 mmol/L   Potassium 3.4 (L) 3.5 - 5.1 mmol/L   Chloride 103 98 - 111 mmol/L   CO2 26 22 - 32 mmol/L   Glucose, Bld 142 (H) 70 - 99 mg/dL    Comment: Glucose reference range applies only to samples taken after fasting for at least 8 hours.   BUN 7 (L) 8 - 23 mg/dL   Creatinine, Ser 4.74 0.61 - 1.24 mg/dL   Calcium 8.9 8.9 - 25.9 mg/dL   Total Protein 6.9 6.5 - 8.1 g/dL   Albumin 3.5 3.5 - 5.0 g/dL   AST 34 15 - 41 U/L   ALT 34 0 - 44 U/L   Alkaline Phosphatase 67 38 - 126 U/L   Total Bilirubin 1.1 0.3 - 1.2 mg/dL   GFR,  Estimated >56 >38 mL/min    Comment: (NOTE) Calculated using the CKD-EPI Creatinine Equation (2021)    Anion gap 11 5 - 15    Comment: Performed at [facility], [address] [place], [place] [address]  Carbamazepine level, total     Status: Abnormal   Collection Time: 04/20/23  9:45 PM  Result Value Ref Range   Carbamazepine Lvl 19.3 (HH) 4.0 - 12.0 ug/mL    Comment: RESULT CONFIRMED BY MANUAL DILUTION CRITICAL RESULT CALLED TO, READ BACK BY AND VERIFIED WITH Rudy Jew RN @ [phone] 04/20/23 JBUTLER Performed at [facility], [address] [place], [place] [address]   Troponin I (High Sensitivity)     Status: Abnormal   Collection Time: 04/20/23  9:45 PM  Result Value Ref Range   Troponin I (High Sensitivity) 32 (H) <18 ng/L    Comment: (NOTE) Elevated high sensitivity troponin I (hsTnI) values and significant  changes across serial measurements may suggest ACS but many other  chronic and acute conditions are known to elevate hsTnI results.  Refer to the "Links" section for chest pain algorithms and additional  guidance. Performed at [facility], [address] [place], [place] [address]    CT Cervical Spine Wo Contrast  Result Date: 04/20/2023 CLINICAL DATA:  Patient found unresponsive on the floor after fall. One episode of vomiting. EXAM: CT CERVICAL SPINE WITHOUT CONTRAST TECHNIQUE: Multidetector CT imaging of the cervical spine was performed without intravenous contrast. Multiplanar CT image reconstructions were also generated. RADIATION DOSE REDUCTION: This exam was performed according to the departmental dose-optimization program which includes automated exposure control, adjustment of the mA and/or kV according to patient size and/or use of iterative reconstruction technique. COMPARISON:  CT cervical spine 01/26/2021 FINDINGS: Alignment: No evidence of traumatic malalignment. Skull base and vertebrae: No acute fracture. No primary bone lesion or focal  pathologic process. Soft tissues and spinal canal: No prevertebral fluid or swelling. No visible canal hematoma. Disc levels: Mild age related spondylosis and facet arthropathy. No severe spinal canal narrowing at Upper chest: No acute abnormality. Other: Carotid calcification. IMPRESSION: No acute fracture in the cervical spine. Electronically Signed   By: Minerva Fester M.D.   On: 04/20/2023 21:51   CT HEAD WO CONTRAST ( )  Result Date: 04/20/2023 CLINICAL DATA:  Head trauma EXAM: CT HEAD WITHOUT CONTRAST TECHNIQUE: Contiguous axial  images were obtained from the base of the skull through the vertex without intravenous contrast. RADIATION DOSE REDUCTION: This exam was performed according to the departmental dose-optimization program which includes automated exposure control, adjustment of the mA and/or kV according to patient size and/or use of iterative reconstruction technique. COMPARISON:  Head CT 01/26/2021 FINDINGS: Brain: No evidence of acute infarction, hemorrhage, hydrocephalus, extra-axial collection or mass lesion/mass effect. Vascular: Atherosclerotic calcifications are present within the cavernous internal carotid arteries. Skull: Normal. Negative for fracture or focal lesion. Sinuses/Orbits: No acute finding. Other: None. IMPRESSION: No acute intracranial abnormality. Electronically Signed   By: Darliss Cheney M.D.   On: 04/20/2023 21:50    Pending Labs Unresulted Labs (From admission, onward)     Start     Ordered   04/20/23 2259  TSH  Once,   URGENT        04/20/23 2258   04/20/23 2259  Cortisol  Once,   URGENT        04/20/23 2258   Pending  CK  Once,   R        Pending            Vitals/Pain Today's Vitals   04/20/23 2114 04/20/23 2200 04/20/23 2215 04/20/23 2230  BP: (!) 154/96 (!) 168/73 (!) 152/80 (!) 152/79  Pulse: 74 73 75 73  Resp: 18 16 16 16   Temp: 98.3 F (36.8 C)     TempSrc: Oral     SpO2: 100% 100% 100% 100%  Weight: 72.3 kg     Height: 6\' 2"  (1.88  m)     PainSc: 0-No pain       Isolation Precautions No active isolations  Medications Medications  sodium chloride 0.9 % bolus 1,000 mL (0 mLs Intravenous Stopped 04/20/23 2310)  sodium chloride 0.9 % bolus 1,000 mL (1,000 mLs Intravenous New Bag/Given 04/20/23 2321)    Mobility non-ambulatory at this time due to weakness and unsteady gait, able to ambulate independently at baseline     Focused Assessments Neuro Assessment Handoff:  Swallow screen pass? Yes          Neuro Assessment: Exceptions to WDL (very unsteady on feet, reports weakness in BIL LE) Neuro Checks:      Has TPA been given? No If patient is a Neuro Trauma and patient is going to OR before floor call report to 4N Charge nurse: [phone] or [phone]   R Recommendations: See Admitting Provider Note  Report given to:   Additional Notes:

## 2023-04-20 NOTE — ED Provider Notes (Addendum)
[place] EMERGENCY DEPARTMENT AT [facility] Provider Note   CSN: [id number] Arrival date & time: 04/20/23  2111     History  Chief Complaint  Patient presents with   Loss of Consciousness    Gregory Spence is a 67 y.o. male hx of seizure, here with altered mental status.  Patient lives at home with his brother.  Brother heard a thump and found him on the floor.  Patient states that he may have passed out.  He is compliant with his Tegretol.  He stopped his Tegretol several months ago and just restarted it by neurology 2 days ago.  Patient did take his evening dose.  Denies any chest pain or shortness of breath.  The history is provided by the patient.       Home Medications Prior to Admission medications   Medication Sig Start Date End Date Taking? Authorizing Provider  atenolol (TENORMIN) 25 MG tablet 25 mg daily.  07/27/11   [provider]  atorvastatin (LIPITOR) 80 MG tablet TAKE 1 TABLET BY MOUTH EVERY DAY NEED APPT FOR MORE REFILLS 06/03/15   [provider]  carbamazepine (TEGRETOL) 200 MG tablet Take 2.5 tablets (500 mg total) by mouth 2 (two) times daily. 04/17/23   Lomax, Amy, NP  ferrous sulfate 325 (65 FE) MG tablet Take 325 mg by mouth every Monday, Wednesday, and Friday.    [provider]  folic acid (FOLVITE) 400 MCG tablet Take 400 mcg by mouth. Takes one tablet on Tues and Sat.    [provider]  Probiotic Product ([employer] PROBIOTIC) CAPS Take 1 tablet by mouth daily. 07/10/18   [provider]      Allergies    Patient has no known allergies.    Review of Systems   Review of Systems  Neurological:  Positive for syncope.  All other systems reviewed and are negative.   Physical Exam Updated Vital Signs BP (!) 152/79   Pulse 73   Temp 98.3 F (36.8 C) (Oral)   Resp 16   Ht 6\' 2"  (1.88 m)   Wt 72.3 kg   SpO2 100%   BMI 20.48 kg/m  Physical Exam Vitals and nursing note reviewed.  Constitutional:       Appearance: Normal appearance.  HENT:     Head: Normocephalic.     Comments: ? Scalp hematoma     Nose: Nose normal.     Mouth/Throat:     Mouth: Mucous membranes are moist.  Eyes:     Extraocular Movements: Extraocular movements intact.     Pupils: Pupils are equal, round, and reactive to light.  Cardiovascular:     Rate and Rhythm: Normal rate and regular rhythm.     Pulses: Normal pulses.     Heart sounds: Normal heart sounds.  Pulmonary:     Effort: Pulmonary effort is normal.     Breath sounds: Normal breath sounds.  Abdominal:     General: Abdomen is flat.     Palpations: Abdomen is soft.  Musculoskeletal:        General: Normal range of motion.     Cervical back: Normal range of motion and neck supple.  Skin:    General: Skin is warm.     Capillary Refill: Capillary refill takes less than 2 seconds.  Neurological:     General: No focal deficit present.     Mental Status: He is alert and oriented to person, place, and time.  Psychiatric:  Mood and Affect: Mood normal.        Behavior: Behavior normal.     ED Results / Procedures / Treatments   Labs (all labs ordered are listed, but only abnormal results are displayed) Labs Reviewed  CBC WITH DIFFERENTIAL/PLATELET - Abnormal; Notable for the following components:      Result Value   RBC 4.11 (*)    Hemoglobin 11.9 (*)    HCT 37.0 (*)    All other components within normal limits  COMPREHENSIVE METABOLIC PANEL  CARBAMAZEPINE LEVEL, TOTAL  TROPONIN I (HIGH SENSITIVITY)    EKG EKG Interpretation Date/Time:  Saturday April 20 2023 21:17:50 EDT Ventricular Rate:  74 PR Interval:  171 QRS Duration:  97 QT Interval:  374 QTC Calculation: 415 R Axis:   62  Text Interpretation: Sinus rhythm Left ventricular hypertrophy No previous ECGs available Confirmed by Richardean Canal [phone]) on 04/20/2023 9:54:23 PM  Radiology CT Cervical Spine Wo Contrast  Result Date: 04/20/2023 CLINICAL DATA:  Patient  found unresponsive on the floor after fall. One episode of vomiting. EXAM: CT CERVICAL SPINE WITHOUT CONTRAST TECHNIQUE: Multidetector CT imaging of the cervical spine was performed without intravenous contrast. Multiplanar CT image reconstructions were also generated. RADIATION DOSE REDUCTION: This exam was performed according to the departmental dose-optimization program which includes automated exposure control, adjustment of the mA and/or kV according to patient size and/or use of iterative reconstruction technique. COMPARISON:  CT cervical spine 01/26/2021 FINDINGS: Alignment: No evidence of traumatic malalignment. Skull base and vertebrae: No acute fracture. No primary bone lesion or focal pathologic process. Soft tissues and spinal canal: No prevertebral fluid or swelling. No visible canal hematoma. Disc levels: Mild age related spondylosis and facet arthropathy. No severe spinal canal narrowing at Upper chest: No acute abnormality. Other: Carotid calcification. IMPRESSION: No acute fracture in the cervical spine. Electronically Signed   By: Minerva Fester M.D.   On: 04/20/2023 21:51   CT HEAD WO CONTRAST ( )  Result Date: 04/20/2023 CLINICAL DATA:  Head trauma EXAM: CT HEAD WITHOUT CONTRAST TECHNIQUE: Contiguous axial images were obtained from the base of the skull through the vertex without intravenous contrast. RADIATION DOSE REDUCTION: This exam was performed according to the departmental dose-optimization program which includes automated exposure control, adjustment of the mA and/or kV according to patient size and/or use of iterative reconstruction technique. COMPARISON:  Head CT 01/26/2021 FINDINGS: Brain: No evidence of acute infarction, hemorrhage, hydrocephalus, extra-axial collection or mass lesion/mass effect. Vascular: Atherosclerotic calcifications are present within the cavernous internal carotid arteries. Skull: Normal. Negative for fracture or focal lesion. Sinuses/Orbits: No acute  finding. Other: None. IMPRESSION: No acute intracranial abnormality. Electronically Signed   By: Darliss Cheney M.D.   On: 04/20/2023 21:50    Procedures Procedures   CRITICAL CARE Performed by: Richardean Canal   Total critical care time: 30 minutes  Critical care time was exclusive of separately billable procedures and treating other patients.  Critical care was necessary to treat or prevent imminent or life-threatening deterioration.  Critical care was time spent personally by me on the following activities: development of treatment plan with patient and/or surrogate as well as nursing, discussions with consultants, evaluation of patient's response to treatment, examination of patient, obtaining history from patient or surrogate, ordering and performing treatments and interventions, ordering and review of laboratory studies, ordering and review of radiographic studies, pulse oximetry and re-evaluation of patient's condition.   Medications Ordered in ED Medications  sodium chloride 0.9 % bolus  1,000 mL (1,000 mLs Intravenous New Bag/Given 04/20/23 2156)    ED Course/ Medical Decision Making/ A&P                                 Medical Decision Making Gregory Spence is a 67 y.o. male here with fall and possible syncope.  Patient is not postictal and do not think he had a seizure.  Since he had head injury will get CT head to rule out bleed.  Will also check electrolytes and orthostatics.  10:55 PM I reviewed patient's labs and they were unremarkable.  Troponin is 32.  Patient was orthostatic initially and unable to even sit up.  After a liter bolus he is still orthostatic and tried to sit him up and he became very dizzy and almost fell and was able to help him back in bed.  At this point we will admit patient for orthostasis.  I discussed with neurologist, Dr. Derry Lory and he states that this is not a known side effect of carbamazepine.  He states that patient could be on Keppra or Vimpat  if this is a concern.  11:26 PM Tegretol level came back supratherapeutic at 19.  I discussed with neurology again.  He states that patient should hold Tegretol and switch to Keppra 500 twice daily.  Symptoms likely from Tegretol toxicity  Problems Addressed: Orthostasis: acute illness or injury  Amount and/or Complexity of Data Reviewed Labs: ordered. Decision-making details documented in ED Course. Radiology: ordered and independent interpretation performed. Decision-making details documented in ED Course.  Risk Decision regarding hospitalization.    Final Clinical Impression(s) / ED Diagnoses Final diagnoses:  None    Rx / DC Orders ED Discharge Orders     None         Charlynne Pander, MD 04/20/23 2303    Charlynne Pander, MD 04/20/23 2326

## 2023-04-20 NOTE — Hospital Course (Signed)
Gregory Spence is a 67 y.o. male with medical history significant for mild intellectual disability, seizure disorder, HTN, HLD who is admitted with symptomatic carbamazepine toxicity.

## 2023-04-21 DIAGNOSIS — T421X1A Poisoning by iminostilbenes, accidental (unintentional), initial encounter: Secondary | ICD-10-CM | POA: Diagnosis not present

## 2023-04-21 DIAGNOSIS — I951 Orthostatic hypotension: Secondary | ICD-10-CM | POA: Diagnosis not present

## 2023-04-21 DIAGNOSIS — E876 Hypokalemia: Secondary | ICD-10-CM | POA: Diagnosis not present

## 2023-04-21 DIAGNOSIS — R7989 Other specified abnormal findings of blood chemistry: Secondary | ICD-10-CM | POA: Diagnosis not present

## 2023-04-21 LAB — BASIC METABOLIC PANEL
Anion gap: 10 (ref 5–15)
BUN: 6 mg/dL — ABNORMAL LOW (ref 8–23)
CO2: 24 mmol/L (ref 22–32)
Calcium: 8.8 mg/dL — ABNORMAL LOW (ref 8.9–10.3)
Chloride: 108 mmol/L (ref 98–111)
Creatinine, Ser: 0.86 mg/dL (ref 0.61–1.24)
GFR, Estimated: >60 mL/min (ref >60)
Glucose, Bld: 96 mg/dL (ref 70–99)
Potassium: 3.8 mmol/L (ref 3.5–5.1)
Sodium: 142 mmol/L (ref 135–145)

## 2023-04-21 LAB — TSH: TSH: 0.304 u[IU]/mL — ABNORMAL LOW (ref 0.350–4.500)

## 2023-04-21 LAB — CBC
HCT: 37.1 % — ABNORMAL LOW (ref 39.0–52.0)
Hemoglobin: 11.9 g/dL — ABNORMAL LOW (ref 13.0–17.0)
MCH: 29.5 pg (ref 26.0–34.0)
MCHC: 32.1 g/dL (ref 30.0–36.0)
MCV: 91.8 fL (ref 80.0–100.0)
Platelets: 226 10*3/uL (ref 150–400)
RBC: 4.04 MIL/uL — ABNORMAL LOW (ref 4.22–5.81)
RDW: 12.9 % (ref 11.5–15.5)
WBC: 7.3 10*3/uL (ref 4.0–10.5)
nRBC: 0 % (ref 0.0–0.2)

## 2023-04-21 LAB — CARBAMAZEPINE LEVEL, TOTAL: Carbamazepine Lvl: 19.7 ug/mL (ref 4.0–12.0)

## 2023-04-21 LAB — CK: Total CK: 333 U/L (ref 49–397)

## 2023-04-21 LAB — HIV ANTIBODY (ROUTINE TESTING W REFLEX): HIV Screen 4th Generation wRfx: NONREACTIVE

## 2023-04-21 LAB — CORTISOL: Cortisol, Plasma: 9 ug/dL

## 2023-04-21 LAB — TROPONIN I (HIGH SENSITIVITY): Troponin I (High Sensitivity): 23 ng/L — ABNORMAL HIGH (ref <18)

## 2023-04-21 MED ORDER — ATORVASTATIN CALCIUM 80 MG PO TABS
80.0000 mg | ORAL_TABLET | Freq: Every day | ORAL | Status: DC
  Administered 2023-04-21 – 2023-04-23 (×3): 80 mg via ORAL
  Filled 2023-04-21 (×3): qty 1

## 2023-04-21 MED ORDER — POTASSIUM CHLORIDE CRYS ER 20 MEQ PO TBCR: 40.0000 meq | EXTENDED_RELEASE_TABLET | ORAL | Status: DC

## 2023-04-21 MED ORDER — POTASSIUM CHLORIDE 20 MEQ PO PACK
40.0000 meq | PACK | Freq: Once | ORAL | Status: AC
  Administered 2023-04-21: 40 meq via ORAL
  Filled 2023-04-21: qty 2

## 2023-04-21 NOTE — Assessment & Plan Note (Addendum)
Hold carbamazepine until level normalizes. When it is within therapeutic range plan to consult neurology to evaluate whether or not the patient needs to be on carbamazepine and what his dose should be. He is currently receiving Keppra.  04-23-2023 tegretol level at discharge 7.9.  neurology recommended changing outpatient AEDs to keppra 500 mg bid. I have messaged pt's primary outpatient neurology provider who will f/u with patient in the office.

## 2023-04-21 NOTE — Assessment & Plan Note (Signed)
Trending downward. No complaints of chest pain.   04-23-2023 resolved

## 2023-04-21 NOTE — Assessment & Plan Note (Signed)
The patient's blood pressure is currently controlled without any antihypertensives.

## 2023-04-21 NOTE — Assessment & Plan Note (Signed)
Improved with potassium of 3.8 from 3.4 on admission. Will again supplement and follow in the morning.   Resolved.

## 2023-04-21 NOTE — Progress Notes (Signed)
Progress Note   Patient: Gregory Spence DOB: [birth year] DOA: 04/20/2023     0 DOS: the patient was seen and examined on 04/21/2023   Brief hospital course: Gregory Spence is a 67 y.o. male with medical history significant for mild intellectual disability, seizure disorder, HTN, HLD who is admitted with symptomatic carbamazepine toxicity.  The patient presented to the ED with complaints of having had an unwitnessed fall followed by a period of unresponsiveness that went on for an unknown period of time.   In the ED he was found to have a carbamazepine level of 19.3. He takes 500 mg of carbamazepine twice daily. The patients last known seizure  was in the 1990's.  His tegretol has been held. This morning his carbamazepine was actually increased to 19.7 this morning. Will continue to monitor levels until the level is within normal levels.   Assessment and Plan: Carbamazepine toxicity, accidental or unintentional, initial encounter Hold carbamazepine until level normalizes. When it is within therapeutic range plan to consult neurology to evaluate whether or not the patient needs to be on carbamazepine and what his dose should be. He is currently receiving Keppra.  Orthostasis This patient has received 1 liter of IV fluids. Awaiting results of orthostatics to evaluate the patient's volume status.  Hypokalemia Improved with potassium of 3.8 from 3.4 on admission. Will again supplement and follow in the morning.   Elevated troponin Trending downward. No complaints of chest pain.   Essential hypertension The patient's blood pressure is currently controlled without any antihypertensives.  Hypercholesterolemia The patient will be continued on atorvastatin 80 mg daily.   Seizure disorder [facility]) The patient was on carbamazepine 500 mg bid chronically for a seizure disorder. His last seizure was in the 1990's. The patient is currently toxic on carbamazepine. It has been held and the  patient has been placed on keppra instead. Will continue to monitor the patient until level comes down to therapeutic levels. Patient will need to follow up with neurology to determine if the patient actually needs to be on a neuroleptic at this point.     Subjective: The patient is resting comfortably. Family is at bedside. No new complaints.  Physical Exam: Vitals:   04/21/23 0500 04/21/23 0614 04/21/23 0752 04/21/23 1557  BP: 137/65 (!) 160/79 (!) 151/75 133/69  Pulse: 73 80 76 78  Resp: 17 16 17 18   Temp:  97.6 F (36.4 C) (!) 97.3 F (36.3 C) (!) 97.4 F (36.3 C)  TempSrc:  Oral Oral Oral  SpO2: 99% 100% 100% 100%  Weight:      Height:       Exam:  Constitutional:  The patient is awake and alert. Eyes:  pupils and irises appear normal Normal lids and conjunctivae ENMT:  grossly normal hearing  Lips appear normal external ears, nose appear normal Oropharynx: mucosa, tongue,posterior pharynx appear normal Neck:  neck appears normal, no masses, normal ROM, supple no thyromegaly Respiratory:  No increased work of breathing. No wheezes, rales, or rhonchi No tactile fremitus Cardiovascular:  Regular rate and rhythm No murmurs, ectopy, or gallups. No lateral PMI. No thrills. Abdomen:  Abdomen is soft, non-tender, non-distended No hernias, masses, or organomegaly Normoactive bowel sounds.  Musculoskeletal:  No cyanosis, clubbing, or edema Skin:  No rashes, lesions, ulcers palpation of skin: no induration or nodules Neurologic:  CN 2-12 intact Sensation all 4 extremities intact Psychiatric:  Mental status Mood, affect appropriate Orientation to person, place, time  judgment and insight  appear intact  Data Reviewed:  Carbamazepine level 19.7.   Family Communication: Family was at bedside. All questions answered to the best of my ability.  Disposition: Status is: Observation The patient remains OBS appropriate and will d/c before 2 midnights.  Planned  Discharge Destination: Home    Time spent: 32 minutes  Author: Zoua Caporaso, DO 04/21/2023 4:26 PM  For on call review [email or web address].

## 2023-04-21 NOTE — Evaluation (Signed)
Physical Therapy Evaluation Patient Details Name: Gregory Spence MRN: [id number] DOB: [birth year] Today's Date: 04/21/2023  History of Present Illness  Pt is a 67 y.o. who presented to the ED 10/26 following an unwitnessed fall and unconscious episode at home. Pt admitted with seizure disorder with carbamazepine toxicity. PMH: mild intellectual disability, seizure disorder, HTN, HLD   Clinical Impression  Pt admitted with above diagnosis. PTA pt lived at home with his brother, independent mobility without AD at household level. Pt currently with functional limitations due to the deficits listed below (see PT Problem List). On eval, he demo mod I bed mobility. CGA transfers, and min assist amb 100' with BUE support on IV pole. Unsteady, mildly ataxic gait. Pt will benefit from acute skilled PT to increase their independence and safety with mobility to allow discharge.  Upon d/c, pt would benefit from HHPT and RW.          If plan is discharge home, recommend the following: A little help with bathing/dressing/bathroom;A little help with walking and/or transfers;Assist for transportation;Help with stairs or ramp for entrance   Can travel by private vehicle        Equipment Recommendations Rolling walker (2 wheels)  Recommendations for Other Services       Functional Status Assessment Patient has had a recent decline in their functional status and demonstrates the ability to make significant improvements in function in a reasonable and predictable amount of time.     Precautions / Restrictions Precautions Precautions: Fall Restrictions Weight Bearing Restrictions: No      Mobility  Bed Mobility Overal bed mobility: Modified Independent                  Transfers Overall transfer level: Needs assistance Equipment used: None Transfers: Sit to/from Stand Sit to Stand: Contact guard assist           General transfer comment: unsteady     Ambulation/Gait Ambulation/Gait assistance: Min assist Gait Distance (Feet): 100 Feet Assistive device: IV Pole Gait Pattern/deviations: Step-through pattern, Decreased stride length, Shuffle, Ataxic Gait velocity: decreased Gait velocity interpretation: <1.31 ft/sec, indicative of household ambulator   General Gait Details: unsteady, mildly ataxic gait; supporting BUE on IV pole  Stairs            Wheelchair Mobility     Tilt Bed    Modified Rankin (Stroke Patients Only)       Balance Overall balance assessment: Needs assistance Sitting-balance support: No upper extremity supported, Feet supported Sitting balance-Leahy Scale: Good     Standing balance support: No upper extremity supported, Bilateral upper extremity supported Standing balance-Leahy Scale: Fair Standing balance comment: static stand without suppport                             Pertinent Vitals/Pain Pain Assessment Pain Assessment: No/denies pain    Home Living Family/patient expects to be discharged to:: Private residence Living Arrangements: Other relatives (twin brother) Available Help at Discharge: Family;Available 24 hours/day Type of Home: House Home Access: Ramped entrance       Home Layout: One level Home Equipment: Shower seat      Prior Function Prior Level of Function : Independent/Modified Independent             Mobility Comments: primarily household ambulator. No AD at baseline. ADLs Comments: mod I basic ADLs     Extremity/Trunk Assessment   Upper Extremity Assessment Upper Extremity Assessment: Defer  to OT evaluation    Lower Extremity Assessment Lower Extremity Assessment: Generalized weakness    Cervical / Trunk Assessment Cervical / Trunk Assessment: Normal  Communication   Communication Communication: No apparent difficulties  Cognition Arousal: Alert Behavior During Therapy: Flat affect Overall Cognitive Status: History of cognitive  impairments - at baseline                                 General Comments: Mild developmental delay at baseline. Slow to respond.        General Comments General comments (skin integrity, edema, etc.): VSS on RA    Exercises     Assessment/Plan    PT Assessment Patient needs continued PT services  PT Problem List Decreased balance;Decreased mobility;Decreased knowledge of use of DME;Decreased activity tolerance       PT Treatment Interventions DME instruction;Functional mobility training;Balance training;Patient/family education;Therapeutic activities;Gait training;Therapeutic exercise    PT Goals (Current goals can be found in the Care Plan section)  Acute Rehab PT Goals Patient Stated Goal: home PT Goal Formulation: With patient/family Time For Goal Achievement: 05/05/23 Potential to Achieve Goals: Good    Frequency Min 1X/week     Co-evaluation               AM-PAC PT "6 Clicks" Mobility  Outcome Measure Help needed turning from your back to your side while in a flat bed without using bedrails?: None Help needed moving from lying on your back to sitting on the side of a flat bed without using bedrails?: None Help needed moving to and from a bed to a chair (including a wheelchair)?: A Little Help needed standing up from a chair using your arms (e.g., wheelchair or bedside chair)?: A Little Help needed to walk in hospital room?: A Little Help needed climbing 3-5 steps with a railing? : A Little 6 Click Score: 20    End of Session Equipment Utilized During Treatment: Gait belt Activity Tolerance: Patient tolerated treatment well Patient left: in bed;with call bell/phone within reach;with family/visitor present Nurse Communication: Mobility status PT Visit Diagnosis: Unsteadiness on feet (R26.81);Difficulty in walking, not elsewhere classified (R26.2)    Time: 1610-9604 PT Time Calculation (min) (ACUTE ONLY): 21 min   Charges:   PT  Evaluation $PT Eval Moderate Complexity: 1 Mod   PT General Charges $$ ACUTE PT VISIT: 1 Visit         Ferd Glassing., PT  Office # [phone]    Ilda Foil 04/21/2023, 1:08 PM

## 2023-04-21 NOTE — ED Notes (Signed)
Received report from [place], [place]. Assuming care of this patient at this time. Pt is resting comfortably, family at bedside.

## 2023-04-21 NOTE — Assessment & Plan Note (Signed)
This patient has received 1 liter of IV fluids. Awaiting results of orthostatics to evaluate the patient's volume status. 04-23-2023 resolved. BP improved.

## 2023-04-21 NOTE — Assessment & Plan Note (Signed)
The patient was on carbamazepine 500 mg bid chronically for a seizure disorder. His last seizure was in the 1990's. The patient is currently toxic on carbamazepine. It has been held and the patient has been placed on keppra instead. Will continue to monitor the patient until level comes down to therapeutic levels. Patient will need to follow up with neurology to determine if the patient actually needs to be on a neuroleptic at this point.  04-23-2023 will discharge on keppra 500 mg bid. Stop tegretol at discharge. F/u with outpatient neurology.

## 2023-04-21 NOTE — Assessment & Plan Note (Signed)
The patient will be continued on atorvastatin 80 mg daily.

## 2023-04-21 NOTE — Plan of Care (Signed)

## 2023-04-21 NOTE — ED Notes (Signed)
ED TO INPATIENT HANDOFF REPORT  ED Nurse Name and Phone #:  Minerva Areola [phone]  S Name/Age/Gender Gregory Spence 67 y.o. male Room/Bed: 045C/045C  Code Status   Code Status: Full Code  Home/SNF/Other Home Patient oriented to: self, place, time, and situation Is this baseline? Yes   Triage Complete: Triage complete  Chief Complaint Carbamazepine toxicity, accidental or unintentional, initial encounter [T42.1X1A]  Triage Note Pt BIB EMS from home called out for a fall, brother had brought him food then heard a fall and found pt unresponsive on the floor pt had one episode of vomiting. Upon FD arrival pt was lethargic, pt was alert to baseline upon EMS arrival. Hx of seizures, reports being off of his medications and restarting Tegretol 2 days ago. Denies witnessed seizure like activity.    Allergies No Known Allergies  Level of Care/Admitting Diagnosis ED Disposition     ED Disposition  Admit   Condition  --   Comment  Hospital Area: [facility] [[id number]]  Level of Care: Telemetry Medical [104]  May place patient in observation at [facility] or [facility] if equivalent level of care is available:: No  Covid Evaluation: Asymptomatic - no recent exposure (last 10 days) testing not required  Diagnosis: Carbamazepine toxicity, accidental or unintentional, initial encounter [[id number]]  Admitting Physician: Charlsie Quest [[id number]]  Attending Physician: Charlsie Quest [[id number]]          B Medical/Surgery History Past Medical History:  Diagnosis Date   Anemia    Hypertension    Seizures (HCC)    History reviewed. No pertinent surgical history.   A IV Location/Drains/Wounds Patient Lines/Drains/Airways Status     Active Line/Drains/Airways     Name Placement date Placement time Site Days   Peripheral IV 04/20/23 20 G Left Antecubital 04/20/23  2000  Antecubital  1            Intake/Output Last 24 hours No intake or output data in the 24 hours  ending 04/21/23 0439  Labs/Imaging Results for orders placed or performed during the hospital encounter of 04/20/23 (from the past 48 hour(s))  CBC with Differential     Status: Abnormal   Collection Time: 04/20/23  9:45 PM  Result Value Ref Range   WBC 6.0 4.0 - 10.5 K/uL   RBC 4.11 (L) 4.22 - 5.81 MIL/uL   Hemoglobin 11.9 (L) 13.0 - 17.0 g/dL   HCT 69.6 (L) 29.5 - 28.4 %   MCV 90.0 80.0 - 100.0 fL   MCH 29.0 26.0 - 34.0 pg   MCHC 32.2 30.0 - 36.0 g/dL   RDW 13.2 44.0 - 10.2 %   Platelets 168 150 - 400 K/uL    Comment: REPEATED TO VERIFY   nRBC 0.0 0.0 - 0.2 %   Neutrophils Relative % 70 %   Neutro Abs 4.2 1.7 - 7.7 K/uL   Lymphocytes Relative 17 %   Lymphs Abs 1.0 0.7 - 4.0 K/uL   Monocytes Relative 11 %   Monocytes Absolute 0.6 0.1 - 1.0 K/uL   Eosinophils Relative 1 %   Eosinophils Absolute 0.1 0.0 - 0.5 K/uL   Basophils Relative 1 %   Basophils Absolute 0.0 0.0 - 0.1 K/uL   Immature Granulocytes 0 %   Abs Immature Granulocytes 0.01 0.00 - 0.07 K/uL    Comment: Performed at [facility], [address], [place], [place] [address]  Comprehensive metabolic panel     Status: Abnormal  Collection Time: 04/20/23  9:45 PM  Result Value Ref Range   Sodium 140 135 - 145 mmol/L   Potassium 3.4 (L) 3.5 - 5.1 mmol/L   Chloride 103 98 - 111 mmol/L   CO2 26 22 - 32 mmol/L   Glucose, Bld 142 (H) 70 - 99 mg/dL    Comment: Glucose reference range applies only to samples taken after fasting for at least 8 hours.   BUN 7 (L) 8 - 23 mg/dL   Creatinine, Ser 7.25 0.61 - 1.24 mg/dL   Calcium 8.9 8.9 - 36.6 mg/dL   Total Protein 6.9 6.5 - 8.1 g/dL   Albumin 3.5 3.5 - 5.0 g/dL   AST 34 15 - 41 U/L   ALT 34 0 - 44 U/L   Alkaline Phosphatase 67 38 - 126 U/L   Total Bilirubin 1.1 0.3 - 1.2 mg/dL   GFR, Estimated >44 >03 mL/min    Comment: (NOTE) Calculated using the CKD-EPI Creatinine Equation (2021)    Anion gap 11 5 - 15    Comment: Performed at [facility], [address], [place], [place] [address]  Carbamazepine level, total     Status: Abnormal   Collection Time: 04/20/23  9:45 PM  Result Value Ref Range   Carbamazepine Lvl 19.3 (HH) 4.0 - 12.0 ug/mL    Comment: RESULT CONFIRMED BY MANUAL DILUTION CRITICAL RESULT CALLED TO, READ BACK BY AND VERIFIED WITH Rudy Jew RN @ [phone] 04/20/23 JBUTLER Performed at [facility], [address] [place], [place] [address]   Troponin I (High Sensitivity)     Status: Abnormal   Collection Time: 04/20/23  9:45 PM  Result Value Ref Range   Troponin I (High Sensitivity) 32 (H) <18 ng/L    Comment: (NOTE) Elevated high sensitivity troponin I (hsTnI) values and significant  changes across serial measurements may suggest ACS but many other  chronic and acute conditions are known to elevate hsTnI results.  Refer to the "Links" section for chest pain algorithms and additional  guidance. Performed at [facility], [address], [place], [place] [address]   TSH     Status: Abnormal   Collection Time: 04/21/23 12:12 AM  Result Value Ref Range   TSH 0.304 (L) 0.350 - 4.500 uIU/mL    Comment: Performed by a 3rd Generation assay with a functional sensitivity of <=0.01 uIU/mL. Performed at [facility], [address], [place], [place] [address]   Cortisol     Status: None   Collection Time: 04/21/23 12:12 AM  Result Value Ref Range   Cortisol, Plasma 9.0 ug/dL    Comment: (NOTE) AM    6.7 - 22.6 ug/dL PM   <66.0       ug/dL Performed at [facility], [address], [place], [place] [address]   CK     Status: None   Collection Time: 04/21/23 12:12 AM  Result Value Ref Range   Total CK 333 49 - 397 U/L    Comment: Performed at [facility], [address], [place], [place] [address]  Troponin I (High Sensitivity)     Status: Abnormal   Collection Time: 04/21/23 12:12 AM  Result Value Ref Range   Troponin I (High Sensitivity) 23 (H) <18 ng/L    Comment: (NOTE) Elevated high  sensitivity troponin I (hsTnI) values and significant  changes across serial measurements may suggest ACS but many other  chronic and acute  conditions are known to elevate hsTnI results.  Refer to the "Links" section for chest pain algorithms and additional  guidance. Performed at [facility], [address] [place], [place] [address]    CT Cervical Spine Wo Contrast  Result Date: 04/20/2023 CLINICAL DATA:  Patient found unresponsive on the floor after fall. One episode of vomiting. EXAM: CT CERVICAL SPINE WITHOUT CONTRAST TECHNIQUE: Multidetector CT imaging of the cervical spine was performed without intravenous contrast. Multiplanar CT image reconstructions were also generated. RADIATION DOSE REDUCTION: This exam was performed according to the departmental dose-optimization program which includes automated exposure control, adjustment of the mA and/or kV according to patient size and/or use of iterative reconstruction technique. COMPARISON:  CT cervical spine 01/26/2021 FINDINGS: Alignment: No evidence of traumatic malalignment. Skull base and vertebrae: No acute fracture. No primary bone lesion or focal pathologic process. Soft tissues and spinal canal: No prevertebral fluid or swelling. No visible canal hematoma. Disc levels: Mild age related spondylosis and facet arthropathy. No severe spinal canal narrowing at Upper chest: No acute abnormality. Other: Carotid calcification. IMPRESSION: No acute fracture in the cervical spine. Electronically Signed   By: Minerva Fester M.D.   On: 04/20/2023 21:51   CT HEAD WO CONTRAST ( )  Result Date: 04/20/2023 CLINICAL DATA:  Head trauma EXAM: CT HEAD WITHOUT CONTRAST TECHNIQUE: Contiguous axial images were obtained from the base of the skull through the vertex without intravenous contrast. RADIATION DOSE REDUCTION: This exam was performed according to the departmental dose-optimization program which includes automated exposure control, adjustment  of the mA and/or kV according to patient size and/or use of iterative reconstruction technique. COMPARISON:  Head CT 01/26/2021 FINDINGS: Brain: No evidence of acute infarction, hemorrhage, hydrocephalus, extra-axial collection or mass lesion/mass effect. Vascular: Atherosclerotic calcifications are present within the cavernous internal carotid arteries. Skull: Normal. Negative for fracture or focal lesion. Sinuses/Orbits: No acute finding. Other: None. IMPRESSION: No acute intracranial abnormality. Electronically Signed   By: Darliss Cheney M.D.   On: 04/20/2023 21:50    Pending Labs Unresulted Labs (From admission, onward)     Start     Ordered   04/21/23 0500  Carbamazepine level, total  Tomorrow morning,   R        04/20/23 2357   04/21/23 0500  HIV Antibody (routine testing w rflx)  (HIV Antibody (Routine testing w reflex) panel)  Tomorrow morning,   R        04/20/23 2357   04/21/23 0500  CBC  Tomorrow morning,   R        04/20/23 2357   04/21/23 0500  Basic metabolic panel  Tomorrow morning,   R        04/20/23 2357            Vitals/Pain Today's Vitals   04/20/23 2215 04/20/23 2230 04/21/23 0100 04/21/23 0132  BP: (!) 152/80 (!) 152/79 (!) 151/75   Pulse: 75 73 75   Resp: 16 16 17    Temp:    98.1 F (36.7 C)  TempSrc:    Oral  SpO2: 100% 100% 100%   Weight:      Height:      PainSc:        Isolation Precautions No active isolations  Medications Medications  levETIRAcetam (KEPPRA) tablet 500 mg (has no administration in time range)  enoxaparin (LOVENOX) injection 40 mg (has no administration in time range)  sodium chloride flush (NS) 0.9 % injection 3 mL (has no administration in  time range)  lactated ringers infusion ( Intravenous New Bag/Given 04/21/23 0033)  acetaminophen (TYLENOL) tablet 650 mg (has no administration in time range)    Or  acetaminophen (TYLENOL) suppository 650 mg (has no administration in time range)  ondansetron (ZOFRAN) tablet 4 mg (has no  administration in time range)    Or  ondansetron (ZOFRAN) injection 4 mg (has no administration in time range)  senna-docusate (Senokot-S) tablet 1 tablet (has no administration in time range)  sodium chloride 0.9 % bolus 1,000 mL (0 mLs Intravenous Stopped 04/20/23 2310)  sodium chloride 0.9 % bolus 1,000 mL (0 mLs Intravenous Stopped 04/21/23 0125)  potassium chloride (KLOR-CON) packet 40 mEq (40 mEq Oral Given 04/21/23 0033)    Mobility walks     Focused Assessments Cardiac Assessment Handoff:    Lab Results  Component Value Date   CKTOTAL 333 04/21/2023   No results found for: "DDIMER" Does the Patient currently have chest pain? No    R Recommendations: See Admitting Provider Note  Report given to:   Additional Notes: alert and cooperative ,20 lt ac, lr @100mls /hr. Family w/pt

## 2023-04-22 DIAGNOSIS — G40909 Epilepsy, unspecified, not intractable, without status epilepticus: Secondary | ICD-10-CM

## 2023-04-22 DIAGNOSIS — E876 Hypokalemia: Secondary | ICD-10-CM

## 2023-04-22 DIAGNOSIS — R7989 Other specified abnormal findings of blood chemistry: Secondary | ICD-10-CM

## 2023-04-22 DIAGNOSIS — I951 Orthostatic hypotension: Secondary | ICD-10-CM

## 2023-04-22 DIAGNOSIS — I10 Essential (primary) hypertension: Secondary | ICD-10-CM

## 2023-04-22 DIAGNOSIS — T421X1A Poisoning by iminostilbenes, accidental (unintentional), initial encounter: Secondary | ICD-10-CM | POA: Diagnosis present

## 2023-04-22 DIAGNOSIS — E7800 Pure hypercholesterolemia, unspecified: Secondary | ICD-10-CM

## 2023-04-22 DIAGNOSIS — Y92009 Unspecified place in unspecified non-institutional (private) residence as the place of occurrence of the external cause: Secondary | ICD-10-CM | POA: Diagnosis not present

## 2023-04-22 DIAGNOSIS — Z800 Family history of malignant neoplasm of digestive organs: Secondary | ICD-10-CM | POA: Diagnosis not present

## 2023-04-22 DIAGNOSIS — Z79899 Other long term (current) drug therapy: Secondary | ICD-10-CM | POA: Diagnosis not present

## 2023-04-22 DIAGNOSIS — Z833 Family history of diabetes mellitus: Secondary | ICD-10-CM | POA: Diagnosis not present

## 2023-04-22 DIAGNOSIS — W19XXXA Unspecified fall, initial encounter: Secondary | ICD-10-CM | POA: Diagnosis present

## 2023-04-22 DIAGNOSIS — F70 Mild intellectual disabilities: Secondary | ICD-10-CM | POA: Diagnosis present

## 2023-04-22 DIAGNOSIS — S0990XA Unspecified injury of head, initial encounter: Secondary | ICD-10-CM | POA: Diagnosis present

## 2023-04-22 DIAGNOSIS — Z8249 Family history of ischemic heart disease and other diseases of the circulatory system: Secondary | ICD-10-CM | POA: Diagnosis not present

## 2023-04-22 MED ORDER — ATENOLOL 50 MG PO TABS
25.0000 mg | ORAL_TABLET | Freq: Every day | ORAL | Status: DC
  Administered 2023-04-23: 25 mg via ORAL
  Filled 2023-04-22: qty 1

## 2023-04-22 NOTE — Evaluation (Signed)
Occupational Therapy Evaluation and Discharge Patient Details Name: Gregory Spence MRN: [id number] DOB: 02-09-1956 Today's Date: 04/22/2023   History of Present Illness Pt is a 67 y.o. who presented to the ED 10/26 following an unwitnessed fall and unconscious episode at home. Pt admitted with seizure disorder with carbamazepine toxicity. PMH: mild intellectual disability, seizure disorder, HTN, HLD   Clinical Impression   Pt is functioning at a set up to supervision level in ADLs and ADLs transfers. No OT needs, recommend ADLs with nursing staff.       If plan is discharge home, recommend the following: Assistance with cooking/housework;Direct supervision/assist for medications management;Direct supervision/assist for financial management;Assist for transportation;Help with stairs or ramp for entrance    Functional Status Assessment     Equipment Recommendations  None recommended by OT    Recommendations for Other Services       Precautions / Restrictions Precautions Precautions: Fall Restrictions Weight Bearing Restrictions: No      Mobility Bed Mobility Overal bed mobility: Modified Independent                  Transfers Overall transfer level: Needs assistance Equipment used: None Transfers: Sit to/from Stand Sit to Stand: Supervision                  Balance Overall balance assessment: Needs assistance   Sitting balance-Leahy Scale: Good       Standing balance-Leahy Scale: Fair Standing balance comment: supervision for dynamic balance                           ADL either performed or assessed with clinical judgement   ADL Overall ADL's : Needs assistance/impaired Eating/Feeding: Independent;Sitting   Grooming: Standing;Wash/dry hands;Supervision/safety   Upper Body Bathing: Set up;Sitting   Lower Body Bathing: Supervison/ safety;Sit to/from stand   Upper Body Dressing : Set up;Sitting   Lower Body Dressing:  Supervision/safety;Sit to/from stand   Toilet Transfer: Supervision/safety;Ambulation   Toileting- Clothing Manipulation and Hygiene: Supervision/safety       Functional mobility during ADLs: Supervision/safety       Vision         Perception         Praxis         Pertinent Vitals/Pain Pain Assessment Pain Assessment: No/denies pain     Extremity/Trunk Assessment Upper Extremity Assessment Upper Extremity Assessment: Overall WFL for tasks assessed   Lower Extremity Assessment Lower Extremity Assessment: Defer to PT evaluation       Communication     Cognition Arousal: Alert Behavior During Therapy: WFL for tasks assessed/performed Overall Cognitive Status: History of cognitive impairments - at baseline                                       General Comments       Exercises     Shoulder Instructions      Home Living Family/patient expects to be discharged to:: Private residence Living Arrangements: Other relatives (twin brother) Available Help at Discharge: Family;Available 24 hours/day Type of Home: House Home Access: Ramped entrance     Home Layout: One level     Bathroom Shower/Tub: Producer, television/film/video: Standard     Home Equipment: Shower seat          Prior Functioning/Environment Prior Level of Function : Independent/Modified Independent  Mobility Comments: primarily household ambulator. No AD at baseline. ADLs Comments: mod I basic ADLs        OT Problem List:        OT Treatment/Interventions:      OT Goals(Current goals can be found in the care plan section)    OT Frequency:      Co-evaluation              AM-PAC OT "6 Clicks" Daily Activity     Outcome Measure Help from another person eating meals?: None Help from another person taking care of personal grooming?: None Help from another person toileting, which includes using toliet, bedpan, or urinal?: None Help  from another person bathing (including washing, rinsing, drying)?: None Help from another person to put on and taking off regular upper body clothing?: None Help from another person to put on and taking off regular lower body clothing?: None 6 Click Score: 24   End of Session Equipment Utilized During Treatment: Gait belt  Activity Tolerance: Patient tolerated treatment well Patient left: in bed;with call bell/phone within reach;with bed alarm set  OT Visit Diagnosis: Unsteadiness on feet (R26.81)                Time: 9562-1308 OT Time Calculation (min): 22 min Charges:  OT General Charges $OT Visit: 1 Visit OT Evaluation $OT Eval Low Complexity: 1 Low  Evern Bio 04/22/2023, 12:37 PM Berna Spare, OTR/L Acute Rehabilitation Services Office: [phone]

## 2023-04-22 NOTE — Progress Notes (Signed)
Transition of Care [facility]) - Inpatient Brief Assessment   Patient Details  Name: JAMESJOSEPH BAUN MRN: [id number] Date of Birth: 22-Nov-1955  Transition of [facility]) CM/SW Contact:    Janae Bridgeman, RN Phone Number: 04/22/2023, 12:39 PM   Clinical Narrative: CM met with the patient at the bedside to discuss TOC needs.  The patient lives with his brother, Linkoln Reynolds at the home and plans to return home when medically stable.  The patient has RW at the home and does not need a replacement.  The patient/brother were offered Medicare choice regarding home health services and the patient/ family did have a preference.  HH order placed for PT to be co-signed by the physician.  [facility] was called and accepted for home health services for PT.  Patient's family will provide transportation to home via car when stable.   Transition of Care Asessment: Insurance and Status: (P) Insurance coverage has been reviewed Patient has primary care physician: (P) Yes Home environment has been reviewed: (P) From home with brother Prior level of function:: (P) Independent Prior/Current Home Services: (P) No current home services Social Determinants of Health Reivew: (P) SDOH reviewed interventions complete Readmission risk has been reviewed: (P) Yes Transition of care needs: (P) transition of care needs identified, TOC will continue to follow

## 2023-04-22 NOTE — Progress Notes (Signed)
Physical Therapy Treatment Patient Details Name: Gregory Spence MRN: [id number] DOB: 11/01/1955 Today's Date: 04/22/2023   History of Present Illness Pt is a 67 y.o. who presented to the ED 10/26 following an unwitnessed fall and unconscious episode at home. Pt admitted with seizure disorder with carbamazepine toxicity. PMH: mild intellectual disability, seizure disorder, HTN, HLD    PT Comments  Pt greeted resting in bed on arrival and agreeable to session with good progress towards acute goals. Pt requiring grossly CGA fading to supervision for transfers and gait. Pt with improved gait mechanics this session, very mild ataxia noted initially, with improvement with increased distance. Pt able to ascend/descend full slight in stairwell without fault with cues for safety. Pt brothers present at end of session and supportive. Pt continues to benefit from skilled PT services to progress toward functional mobility goals.     If plan is discharge home, recommend the following: A little help with bathing/dressing/bathroom;A little help with walking and/or transfers;Assist for transportation;Help with stairs or ramp for entrance   Can travel by private vehicle        Equipment Recommendations  Rolling walker (2 wheels)    Recommendations for Other Services       Precautions / Restrictions Precautions Precautions: Fall Restrictions Weight Bearing Restrictions: No     Mobility  Bed Mobility Overal bed mobility: Modified Independent                  Transfers Overall transfer level: Needs assistance Equipment used: None Transfers: Sit to/from Stand Sit to Stand: Supervision                Ambulation/Gait Ambulation/Gait assistance: Contact guard assist, Supervision Gait Distance (Feet): 500 Feet Assistive device: None Gait Pattern/deviations: Step-through pattern, Decreased stride length Gait velocity: slightly decreased     General Gait Details: mildly unsteady at  start, progressing to supervision with increased dsitance   Stairs Stairs: Yes Stairs assistance: Contact guard assist Stair Management: One rail Right, Alternating pattern, Forwards Number of Stairs: 10 General stair comments: up/down with step-over step pattern, no LOB, cues to utilize rail for safety   Wheelchair Mobility     Tilt Bed    Modified Rankin (Stroke Patients Only)       Balance Overall balance assessment: Needs assistance   Sitting balance-Leahy Scale: Good     Standing balance support: No upper extremity supported, During functional activity Standing balance-Leahy Scale: Fair Standing balance comment: supervision for dynamic balance                            Cognition Arousal: Alert Behavior During Therapy: WFL for tasks assessed/performed Overall Cognitive Status: History of cognitive impairments - at baseline                                          Exercises      General Comments General comments (skin integrity, edema, etc.): VSS on RA, pt bothers Calvin and Stanley arriving during session      Pertinent Vitals/Pain Pain Assessment Pain Assessment: No/denies pain    Home Living                          Prior Function            PT Goals (current goals  can now be found in the care plan section) Acute Rehab PT Goals PT Goal Formulation: With patient/family Time For Goal Achievement: 05/05/23 Progress towards PT goals: Progressing toward goals    Frequency    Min 1X/week      PT Plan      Co-evaluation              AM-PAC PT "6 Clicks" Mobility   Outcome Measure  Help needed turning from your back to your side while in a flat bed without using bedrails?: None Help needed moving from lying on your back to sitting on the side of a flat bed without using bedrails?: None Help needed moving to and from a bed to a chair (including a wheelchair)?: A Little Help needed standing up  from a chair using your arms (e.g., wheelchair or bedside chair)?: A Little Help needed to walk in hospital room?: A Little Help needed climbing 3-5 steps with a railing? : A Little 6 Click Score: 20    End of Session Equipment Utilized During Treatment: Gait belt Activity Tolerance: Patient tolerated treatment well Patient left: in bed;with call bell/phone within reach;with family/visitor present Nurse Communication: Mobility status PT Visit Diagnosis: Unsteadiness on feet (R26.81);Difficulty in walking, not elsewhere classified (R26.2)     Time: 4098-1191 PT Time Calculation (min) (ACUTE ONLY): 11 min  Charges:    $Gait Training: 8-22 mins PT General Charges $$ ACUTE PT VISIT: 1 Visit                     Gennett Garcia R. PTA Acute Rehabilitation Services Office: [phone]   Catalina Antigua 04/22/2023, 4:10 PM

## 2023-04-22 NOTE — Progress Notes (Signed)
Mobility Specialist Progress Note:    04/22/23 0900  Mobility  Activity Ambulated with assistance in hallway  Level of Assistance Contact guard assist, steadying assist  Assistive Device None  Distance Ambulated (ft) 180 ft  Activity Response Tolerated well  Mobility Referral Yes  $Mobility charge 1 Mobility  Mobility Specialist Start Time (ACUTE ONLY) 0900  Mobility Specialist Stop Time (ACUTE ONLY) 0915  Mobility Specialist Time Calculation (min) (ACUTE ONLY) 15 min   Pt received in bed, agreeable to mobility session. Ambulated with CGA for safety, no AD required. Tolerated well, asx throughout. Returned pt to room, all needs met.   Feliciana Rossetti Mobility Specialist Please contact via Special educational needs teacher or  Rehab office at [phone]

## 2023-04-22 NOTE — Progress Notes (Signed)
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 In the ED, carbamazepine level noted 19.3. He takes 500 mg of carbamazepine twice daily. Last known seizure  in the 1990's. His tegretol has been held. He is admitted to hospitalist service and yesterday carbamazepine increased to 19.7. He denies any complaints.  Assessment and Plan: * Carbamazepine toxicity, accidental or unintentional, initial encounter Continue to hold carbamazepine until level normalizes.  Consider neurology evaluation whether or not the patient needs to be on carbamazepine as his seizures more than 20 years ago. Continue Keppra.  Orthostasis Orthostatic vitals improved with IV hydration. He is eating fair. Encourage oral hydration.   Hypokalemia Improved with supplements.  Elevated troponin Trending downward. No complaints of chest pain.   Hypercholesterolemia Continue atorvastatin 80 mg daily.   Essential hypertension BP elevated. Restarted home Atenolol 25 mg daily.  Seizure disorder [facility]) Neurology eval once tegretol level normalizes for antiseizure meds. Continue Keppra 500mg  bid for now. Seizure precautions.     Out of bed to chair. Incentive spirometry. Nursing supportive care. Fall, aspiration precautions. DVT prophylaxis   Code Status: Full Code  Subjective: Patient is seen and examined today morning. He is lying comfortably, eating fair. Denies any complaints of dizziness. Did get out of bed. Worked with PT.  Physical Exam: Vitals:   04/22/23 0700 04/22/23 0734 04/22/23 0746 04/22/23 1145  BP: 135/68 135/68  131/72  Pulse: 75 75  82  Resp:    14  Temp:  (!) 97.4 F (36.3 C) 98.4 F  (36.9 C) 98.5 F (36.9 C)  TempSrc: Oral Oral Oral Oral  SpO2: 100%   100%  Weight:      Height:        General - Elderly African American male, no apparent distress HEENT - PERRLA, EOMI, atraumatic head, non tender sinuses. Lung - Clear, basal rales, no rhonchi, wheezes. Heart - S1, S2 heard, no murmurs, rubs, trace pedal edema. Abdomen - Soft, non tender, bowel sounds good Neuro - Alert, awake and oriented, non focal exam. Skin - Warm and dry.  Data Reviewed:      Latest Ref Rng & Units 04/21/2023    4:24 AM 04/20/2023    9:45 PM 04/17/2023    8:28 AM  CBC  WBC 4.0 - 10.5 K/uL 7.3  6.0  4.1   Hemoglobin 13.0 - 17.0 g/dL 21.3  08.6  57.8   Hematocrit 39.0 - 52.0 % 37.1  37.0  39.7   Platelets 150 - 400 K/uL 226  168  296       Latest Ref Rng & Units 04/21/2023    4:24 AM 04/20/2023    9:45 PM 04/17/2023    8:28 AM  BMP  Glucose 70 - 99 mg/dL 96  469  629   BUN 8 - 23 mg/dL 6  7  15    Creatinine 0.61 - 1.24 mg/dL 5.28  4.13  2.44   BUN/Creat Ratio 10 - 24   16   Sodium 135 - 145 mmol/L 142  140  142   Potassium 3.5 - 5.1 mmol/L 3.8  3.4  3.9   Chloride 98 - 111 mmol/L 108  103  102   CO2  22 - 32 mmol/L 24  26  24    Calcium 8.9 - 10.3 mg/dL 8.8  8.9  9.5    CT Cervical Spine Wo Contrast  Result Date: 04/20/2023 CLINICAL DATA:  Patient found unresponsive on the floor after fall. One episode of vomiting. EXAM: CT CERVICAL SPINE WITHOUT CONTRAST TECHNIQUE: Multidetector CT imaging of the cervical spine was performed without intravenous contrast. Multiplanar CT image reconstructions were also generated. RADIATION DOSE REDUCTION: This exam was performed according to the departmental dose-optimization program which includes automated exposure control, adjustment of the mA and/or kV according to patient size and/or use of iterative reconstruction technique. COMPARISON:  CT cervical spine 01/26/2021 FINDINGS: Alignment: No evidence of traumatic malalignment. Skull base and  vertebrae: No acute fracture. No primary bone lesion or focal pathologic process. Soft tissues and spinal canal: No prevertebral fluid or swelling. No visible canal hematoma. Disc levels: Mild age related spondylosis and facet arthropathy. No severe spinal canal narrowing at Upper chest: No acute abnormality. Other: Carotid calcification. IMPRESSION: No acute fracture in the cervical spine. Electronically Signed   By: Minerva Fester M.D.   On: 04/20/2023 21:51   CT HEAD WO CONTRAST ( )  Result Date: 04/20/2023 CLINICAL DATA:  Head trauma EXAM: CT HEAD WITHOUT CONTRAST TECHNIQUE: Contiguous axial images were obtained from the base of the skull through the vertex without intravenous contrast. RADIATION DOSE REDUCTION: This exam was performed according to the departmental dose-optimization program which includes automated exposure control, adjustment of the mA and/or kV according to patient size and/or use of iterative reconstruction technique. COMPARISON:  Head CT 01/26/2021 FINDINGS: Brain: No evidence of acute infarction, hemorrhage, hydrocephalus, extra-axial collection or mass lesion/mass effect. Vascular: Atherosclerotic calcifications are present within the cavernous internal carotid arteries. Skull: Normal. Negative for fracture or focal lesion. Sinuses/Orbits: No acute finding. Other: None. IMPRESSION: No acute intracranial abnormality. Electronically Signed   By: Darliss Cheney M.D.   On: 04/20/2023 21:50     Family Communication: Discussed with patient, he understands and agrees. All questions answereed.    Disposition: Status is: Inpatient Remains inpatient appropriate because: Carbamazepine level monitoring. Neuro checks  Planned Discharge Destination: Home with Home Health     Time spent: 38 minutes  Author: Marcelino Duster, MD 04/22/2023 6:08 PM Secure chat 7am to 7pm For on call review [email or web address].

## 2023-04-23 ENCOUNTER — Telehealth: Payer: Self-pay | Admitting: Family Medicine

## 2023-04-23 DIAGNOSIS — E876 Hypokalemia: Secondary | ICD-10-CM | POA: Diagnosis not present

## 2023-04-23 DIAGNOSIS — I951 Orthostatic hypotension: Secondary | ICD-10-CM | POA: Diagnosis not present

## 2023-04-23 DIAGNOSIS — T421X1A Poisoning by iminostilbenes, accidental (unintentional), initial encounter: Secondary | ICD-10-CM | POA: Diagnosis not present

## 2023-04-23 DIAGNOSIS — I10 Essential (primary) hypertension: Secondary | ICD-10-CM | POA: Diagnosis not present

## 2023-04-23 LAB — CARBAMAZEPINE LEVEL, TOTAL: Carbamazepine Lvl: 7.1 ug/mL (ref 4.0–12.0)

## 2023-04-23 MED ORDER — LEVETIRACETAM 500 MG PO TABS: 500.0000 mg | ORAL_TABLET | Freq: Two times a day (BID) | ORAL | 0 refills | Status: DC

## 2023-04-23 MED ORDER — ATENOLOL 25 MG PO TABS: 25.0000 mg | ORAL_TABLET | Freq: Every day | ORAL | 0 refills | Status: DC

## 2023-04-23 NOTE — Telephone Encounter (Signed)
LVM for brother to call (on DPR).   Phone room: please relay Amy's message if he calls

## 2023-04-23 NOTE — Telephone Encounter (Signed)
Please call his brother, Duffy Rhody. Let him know that I have scheduled an appt with me 11/6 at 8:30 to review hospitalization and changes in seizure treatment plan.

## 2023-04-23 NOTE — Subjective & Objective (Signed)
Pt seen and examined. Able to walk 500 feet with physical therapy.

## 2023-04-23 NOTE — Progress Notes (Signed)
PROGRESS NOTE    Gregory Spence  KZS:[id number] DOB: May 11, 1956 DOA: 04/20/2023 PCP: Verlon Au, MD  Subjective: Pt seen and examined. Able to walk 500 feet with physical therapy.   Hospital Course: HPI: Gregory Spence is a 67 y.o. male with medical history significant for mild intellectual disability, seizure disorder, HTN, HLD who presented to the ED for evaluation after an unwitnessed fall at home with unresponsive episode.  History limited from patient due to intellectual disability otherwise supplemented by EDP, chart review, and his brothers at bedside.   Patient lives at home with his brother.  His brother heard patient fall and rushed to check in on him.  Patient was laying on the ground but awake and speaking.  Normally patient ambulates well on his own but he was unable to stand up due to weakness and unsteady gait.  Patient assumed that he might of had a seizure.   Patient was recently started back on his carbamazepine 500 mg BID 2 days ago after he had been off for a few weeks after running out.  Family states that patient's last known seizure was in the 82s.  Significant Events: Admitted 04/20/2023 for tegretol toxicity Admitting tegretol level 19.3(ref range 4-12 micrograms/ml) 04-23-2023 tegretol level 7.1  Significant Labs:   Significant Imaging Studies: CT head negative for CVA CT c-spine negative for fracture  Antibiotic Therapy: Anti-infectives (From admission, onward)    None       Procedures:   Consultants:     Assessment and Plan: * Carbamazepine toxicity, accidental or unintentional, initial encounter Hold carbamazepine until level normalizes. When it is within therapeutic range plan to consult neurology to evaluate whether or not the patient needs to be on carbamazepine and what his dose should be. He is currently receiving Keppra.  04-23-2023 tegretol level at discharge 7.9.  neurology recommended changing outpatient AEDs to keppra  500 mg bid.  Orthostasis This patient has received 1 liter of IV fluids. Awaiting results of orthostatics to evaluate the patient's volume status.  Hypokalemia Improved with potassium of 3.8 from 3.4 on admission. Will again supplement and follow in the morning.   Elevated troponin Trending downward. No complaints of chest pain.   Hypercholesterolemia The patient will be continued on atorvastatin 80 mg daily.   Essential hypertension The patient's blood pressure is currently controlled without any antihypertensives.  Seizure disorder [facility]) The patient was on carbamazepine 500 mg bid chronically for a seizure disorder. His last seizure was in the 1990's. The patient is currently toxic on carbamazepine. It has been held and the patient has been placed on keppra instead. Will continue to monitor the patient until level comes down to therapeutic levels. Patient will need to follow up with neurology to determine if the patient actually needs to be on a neuroleptic at this point.       DVT prophylaxis: enoxaparin (LOVENOX) injection 40 mg Start: 04/21/23 2200     Code Status: Full Code Family Communication: no family at bedside. Unable to reach his sister Marlene Lard @ [phone] Disposition Plan: return home Reason for continuing need for hospitalization: medically stable for discharge.  Objective: Vitals:   04/22/23 1840 04/22/23 2003 04/23/23 0417 04/23/23 0728  BP: (!) 161/72 (!) 144/65 121/68 133/76  Pulse: 81 84 85 83  Resp: 14 18 18 17   Temp: 98.1 F (36.7 C) (!) 97.4 F (36.3 C) 98.1 F (36.7 C) 97.9 F (36.6 C)  TempSrc: Oral Oral Oral Oral  SpO2: 100%  100% 100% 100%  Weight:      Height:        Intake/Output Summary (Last 24 hours) at 04/23/2023 1205 Last data filed at 04/22/2023 2210 Gross per 24 hour  Intake 120 ml  Output 850 ml  Net -730 ml   Filed Weights   04/20/23 2114  Weight: 72.3 kg    Examination:  Physical Exam Vitals and nursing  note reviewed.  Constitutional:      General: He is not in acute distress.    Appearance: He is not toxic-appearing.     Comments: Chronically ill appearing  HENT:     Head: Normocephalic and atraumatic.  Eyes:     General: No scleral icterus. Cardiovascular:     Rate and Rhythm: Normal rate.  Pulmonary:     Effort: Pulmonary effort is normal. No respiratory distress.  Musculoskeletal:     Right lower leg: No edema.     Left lower leg: No edema.  Skin:    General: Skin is warm and dry.  Neurological:     Comments: dysarthria     Data Reviewed: I have personally reviewed following labs and imaging studies  CBC: Recent Labs  Lab 04/17/23 0828 04/20/23 2145 04/21/23 0424  WBC 4.1 6.0 7.3  NEUTROABS 2.5 4.2  --   HGB 12.9* 11.9* 11.9*  HCT 39.7 37.0* 37.1*  MCV 92 90.0 91.8  PLT 296 168 226   Basic Metabolic Panel: Recent Labs  Lab 04/17/23 0828 04/20/23 2145 04/21/23 0424  NA 142 140 142  K 3.9 3.4* 3.8  CL 102 103 108  CO2 24 26 24   GLUCOSE 103* 142* 96  BUN 15 7* 6*  CREATININE 0.96 1.00 0.86  CALCIUM 9.5 8.9 8.8*   GFR: Estimated Creatinine Clearance: 84.1 mL/min (by C-G formula based on SCr of 0.86 mg/dL). Liver Function Tests: Recent Labs  Lab 04/17/23 0828 04/20/23 2145  AST 49* 34  ALT 49* 34  ALKPHOS 88 67  BILITOT 1.1 1.1  PROT 7.3 6.9  ALBUMIN 4.5 3.5   Cardiac Enzymes: Recent Labs  Lab 04/21/23 0012  CKTOTAL 333   Thyroid Function Tests: Recent Labs    04/21/23 0012  TSH 0.304*   Lab Results  Component Value Date/Time   CBMZ 7.1 04/23/2023 06:13 AM   CBMZ 19.7 (HH) 04/21/2023 04:24 AM   CBMZ 19.3 (HH) 04/20/2023 09:45 PM   CBMZ <2.0 (L) 04/17/2023 08:28 AM   CBMZ 12.1 (H) 05/24/2021 08:45 AM   CBMZ 14.4 (HH) 04/26/2021 02:40 PM    Radiology Studies: No results found.  Scheduled Meds:  atenolol  25 mg Oral Daily   atorvastatin  80 mg Oral Daily   enoxaparin (LOVENOX) injection  40 mg Subcutaneous Q24H    levETIRAcetam  500 mg Oral BID   sodium chloride flush  3 mL Intravenous Q12H   Continuous Infusions:   LOS: 1 day   Time spent: 35 minutes  Carollee Herter, DO  [facility]  04/23/2023, 12:05 PM

## 2023-04-23 NOTE — Progress Notes (Signed)
Mobility Specialist Progress Note:    04/23/23 0841  Mobility  Activity Ambulated independently in hallway  Level of Assistance Contact guard assist, steadying assist  Assistive Device None  Distance Ambulated (ft) 500 ft  Activity Response Tolerated well  Mobility Referral Yes  $Mobility charge 1 Mobility  Mobility Specialist Start Time (ACUTE ONLY) 0841  Mobility Specialist Stop Time (ACUTE ONLY) 0851  Mobility Specialist Time Calculation (min) (ACUTE ONLY) 10 min   Pt received in bed, agreeable to mobility session. Tolerated well, asx throughout. No AD required, CGA for safety, other pt independent. Returned pt to room, all needs met.   Feliciana Rossetti Mobility Specialist Please contact via Special educational needs teacher or  Rehab office at [phone]

## 2023-04-23 NOTE — Discharge Summary (Addendum)
[facility] Phys[facility] Discharge Summary   Patient name: Gregory Spence  Admit date:     04/20/2023  Discharge date: 04/23/2023  Attending Physician: Marcelino Duster [[id number]]  Discharge Physician: Carollee Herter   PCP: Verlon Au, MD  Admitted From: Home  Disposition:  Home  Recommendations for Outpatient Follow-up:  Follow up with PCP in 2 weeks Follow up with neurology in 2 weeks  Home Health:No Equipment/Devices: None    Discharge Condition:Stable CODE STATUS:FULL Diet recommendation: Heart Healthy Fluid Restriction: None  Hospital Summary: HPI: Gregory Spence is a 67 y.o. male with medical history significant for mild intellectual disability, seizure disorder, HTN, HLD who presented to the ED for evaluation after an unwitnessed fall at home with unresponsive episode.  History limited from patient due to intellectual disability otherwise supplemented by EDP, chart review, and his brothers at bedside.   Patient lives at home with his brother.  His brother heard patient fall and rushed to check in on him.  Patient was laying on the ground but awake and speaking.  Normally patient ambulates well on his own but he was unable to stand up due to weakness and unsteady gait.  Patient assumed that he might of had a seizure.   Patient was recently started back on his carbamazepine 500 mg BID 2 days ago after he had been off for a few weeks after running out.  Family states that patient's last known seizure was in the 94s.  Significant Events: Admitted 04/20/2023 for tegretol toxicity Admitting tegretol level 19.3(ref range 4-12 micrograms/ml) 04-23-2023 tegretol level 7.1  Significant Labs:   Significant Imaging Studies: CT head negative for CVA CT c-spine negative for fracture  Antibiotic Therapy: Anti-infectives (From admission, onward)    None       Procedures:   Consultants:    Hospital Course by Problem: * Carbamazepine toxicity,  accidental or unintentional, initial encounter Hold carbamazepine until level normalizes. When it is within therapeutic range plan to consult neurology to evaluate whether or not the patient needs to be on carbamazepine and what his dose should be. He is currently receiving Keppra.  04-23-2023 tegretol level at discharge 7.9.  neurology recommended changing outpatient AEDs to keppra 500 mg bid. I have messaged pt's primary outpatient neurology provider who will f/u with patient in the office.  Orthostasis This patient has received 1 liter of IV fluids. Awaiting results of orthostatics to evaluate the patient's volume status. 04-23-2023 resolved. BP improved.  Hypokalemia Improved with potassium of 3.8 from 3.4 on admission. Will again supplement and follow in the morning.   Resolved.  Elevated troponin Trending downward. No complaints of chest pain.   04-23-2023 resolved  Hypercholesterolemia The patient will be continued on atorvastatin 80 mg daily.   Essential hypertension The patient's blood pressure is currently controlled without any antihypertensives.  Seizure disorder [facility]) The patient was on carbamazepine 500 mg bid chronically for a seizure disorder. His last seizure was in the 1990's. The patient is currently toxic on carbamazepine. It has been held and the patient has been placed on keppra instead. Will continue to monitor the patient until level comes down to therapeutic levels. Patient will need to follow up with neurology to determine if the patient actually needs to be on a neuroleptic at this point.  04-23-2023 will discharge on keppra 500 mg bid. Stop tegretol at discharge. F/u with outpatient neurology.    Discharge Diagnoses:  Principal Problem:   Carbamazepine toxicity, accidental or unintentional, initial encounter  Active Problems:   Orthostasis   Hypokalemia   Elevated troponin   Seizure disorder (HCC)   Essential hypertension   Hypercholesterolemia    Carbamazepine poisoning, accidental or unintentional, initial encounter   Discharge Instructions  Discharge Instructions     Call MD for:  difficulty breathing, headache or visual disturbances   Complete by: As directed    Call MD for:  extreme fatigue   Complete by: As directed    Call MD for:  hives   Complete by: As directed    Call MD for:  persistant dizziness or light-headedness   Complete by: As directed    Call MD for:  persistant nausea and vomiting   Complete by: As directed    Call MD for:  temperature >100.4   Complete by: As directed    Diet - low sodium heart healthy   Complete by: As directed    Discharge instructions   Complete by: As directed    1. Follow up with your primary care provider within 2 weeks following discharge from hospital 2. Follow up with your primary neurology provider within 2 weeks following discharge from hospital   Increase activity slowly   Complete by: As directed       Allergies as of 04/23/2023   No Known Allergies      Medication List     STOP taking these medications    carbamazepine 200 MG tablet Commonly known as: TEGRETOL       TAKE these medications    atenolol 25 MG tablet Commonly known as: TENORMIN Take 1 tablet (25 mg total) by mouth daily. What changed: how to take this   atorvastatin 80 MG tablet Commonly known as: LIPITOR TAKE 1 TABLET BY MOUTH EVERY DAY NEED APPT FOR MORE REFILLS   levETIRAcetam 500 MG tablet Commonly known as: KEPPRA Take 1 tablet (500 mg total) by mouth 2 (two) times daily.        Follow-up Information     Care, [facility] Follow up.   Specialty: Home Health Services Why: [facility] will be providing home health services.  They will call you in the next 24-48 hours to set up services. Contact information: [address] [place] [address] [phone]                No Known Allergies  Discharge Exam: Vitals:   04/23/23 0417 04/23/23  0728  BP: 121/68 133/76  Pulse: 85 83  Resp: 18 17  Temp: 98.1 F (36.7 C) 97.9 F (36.6 C)  SpO2: 100% 100%    Physical Exam Vitals and nursing note reviewed.  Constitutional:      General: He is not in acute distress.    Appearance: He is not toxic-appearing.     Comments: Chronically ill appearing  HENT:     Head: Normocephalic and atraumatic.  Eyes:     General: No scleral icterus. Cardiovascular:     Rate and Rhythm: Normal rate.  Pulmonary:     Effort: Pulmonary effort is normal. No respiratory distress.  Musculoskeletal:     Right lower leg: No edema.     Left lower leg: No edema.  Skin:    General: Skin is warm and dry.  Neurological:     Comments: dysarthria     The results of significant diagnostics from this hospitalization (including imaging, microbiology, ancillary and laboratory) are listed below for reference.    Microbiology: No results found for this or any  previous visit (from the past 240 hour(s)).   Labs: BNP (last 3 results) No results for input(s): "BNP" in the last 8760 hours. Basic Metabolic Panel: Recent Labs  Lab 04/17/23 0828 04/20/23 2145 04/21/23 0424  NA 142 140 142  K 3.9 3.4* 3.8  CL 102 103 108  CO2 24 26 24   GLUCOSE 103* 142* 96  BUN 15 7* 6*  CREATININE 0.96 1.00 0.86  CALCIUM 9.5 8.9 8.8*   Liver Function Tests: Recent Labs  Lab 04/17/23 0828 04/20/23 2145  AST 49* 34  ALT 49* 34  ALKPHOS 88 67  BILITOT 1.1 1.1  PROT 7.3 6.9  ALBUMIN 4.5 3.5   No results for input(s): "LIPASE", "AMYLASE" in the last 168 hours. No results for input(s): "AMMONIA" in the last 168 hours. CBC: Recent Labs  Lab 04/17/23 0828 04/20/23 2145 04/21/23 0424  WBC 4.1 6.0 7.3  NEUTROABS 2.5 4.2  --   HGB 12.9* 11.9* 11.9*  HCT 39.7 37.0* 37.1*  MCV 92 90.0 91.8  PLT 296 168 226   Cardiac Enzymes: Recent Labs  Lab 04/21/23 0012  CKTOTAL 333   Recent Labs    04/20/23 2145 04/21/23 0424 04/23/23 0613  CBMZ 19.3* 19.7*  7.1     Thyroid function studies Recent Labs    04/21/23 0012  TSH 0.304*   Urinalysis    Component Value Date/Time   COLORURINE YELLOW 01/26/2021 2050   APPEARANCEUR HAZY (A) 01/26/2021 2050   LABSPEC 1.009 01/26/2021 2050   PHURINE 5.0 01/26/2021 2050   GLUCOSEU NEGATIVE 01/26/2021 2050   HGBUR NEGATIVE 01/26/2021 2050   BILIRUBINUR NEGATIVE 01/26/2021 2050   KETONESUR NEGATIVE 01/26/2021 2050   PROTEINUR NEGATIVE 01/26/2021 2050   NITRITE NEGATIVE 01/26/2021 2050   LEUKOCYTESUR NEGATIVE 01/26/2021 2050   Sepsis Labs Recent Labs  Lab 04/17/23 0828 04/20/23 2145 04/21/23 0424  WBC 4.1 6.0 7.3   Microbiology No results found for this or any previous visit (from the past 240 hour(s)).  Procedures/Studies: CT Cervical Spine Wo Contrast  Result Date: 04/20/2023 CLINICAL DATA:  Patient found unresponsive on the floor after fall. One episode of vomiting. EXAM: CT CERVICAL SPINE WITHOUT CONTRAST TECHNIQUE: Multidetector CT imaging of the cervical spine was performed without intravenous contrast. Multiplanar CT image reconstructions were also generated. RADIATION DOSE REDUCTION: This exam was performed according to the departmental dose-optimization program which includes automated exposure control, adjustment of the mA and/or kV according to patient size and/or use of iterative reconstruction technique. COMPARISON:  CT cervical spine 01/26/2021 FINDINGS: Alignment: No evidence of traumatic malalignment. Skull base and vertebrae: No acute fracture. No primary bone lesion or focal pathologic process. Soft tissues and spinal canal: No prevertebral fluid or swelling. No visible canal hematoma. Disc levels: Mild age related spondylosis and facet arthropathy. No severe spinal canal narrowing at Upper chest: No acute abnormality. Other: Carotid calcification. IMPRESSION: No acute fracture in the cervical spine. Electronically Signed   By: Minerva Fester M.D.   On: 04/20/2023 21:51   CT  HEAD WO CONTRAST ( )  Result Date: 04/20/2023 CLINICAL DATA:  Head trauma EXAM: CT HEAD WITHOUT CONTRAST TECHNIQUE: Contiguous axial images were obtained from the base of the skull through the vertex without intravenous contrast. RADIATION DOSE REDUCTION: This exam was performed according to the departmental dose-optimization program which includes automated exposure control, adjustment of the mA and/or kV according to patient size and/or use of iterative reconstruction technique. COMPARISON:  Head CT 01/26/2021 FINDINGS: Brain: No evidence of acute infarction,  hemorrhage, hydrocephalus, extra-axial collection or mass lesion/mass effect. Vascular: Atherosclerotic calcifications are present within the cavernous internal carotid arteries. Skull: Normal. Negative for fracture or focal lesion. Sinuses/Orbits: No acute finding. Other: None. IMPRESSION: No acute intracranial abnormality. Electronically Signed   By: Darliss Cheney M.D.   On: 04/20/2023 21:50    Time coordinating discharge: 35 mins  SIGNED:  Carollee Herter, DO [facility] 04/23/23, 12:20 PM

## 2023-04-24 NOTE — Telephone Encounter (Signed)
Called and spoke with [place]. Relayed his brother's appt in 05/01/23 at 8:30am. He verbalized understanding and appreciation.

## 2023-04-29 NOTE — Patient Instructions (Signed)
Below is our plan:  We will continue levetiracetam 500mg  twice daily   Please make sure you are consistent with timing of seizure medication. I recommend annual visit with primary care provider (PCP) for complete physical and routine blood work. I recommend daily intake of vitamin D (400-800iu) and calcium (800-1000mg ) for bone health. Discuss Dexa screening with PCP.   According to [place] law, you can not drive unless you are seizure / syncope free for at least 6 months and under physician's care.  Please maintain precautions. Do not participate in activities where a loss of awareness could harm you or someone else. No swimming alone, no tub bathing, no hot tubs, no driving, no operating motorized vehicles (cars, ATVs, motocycles, etc), lawnmowers, power tools or firearms. No standing at heights, such as rooftops, ladders or stairs. Avoid hot objects such as stoves, heaters, open fires. Wear a helmet when riding a bicycle, scooter, skateboard, etc. and avoid areas of traffic. Set your water heater to 120 degrees or less.  SUDEP is the sudden, unexpected death of someone with epilepsy, who was otherwise healthy. In SUDEP cases, no other cause of death is found when an autopsy is done. Each year, more than 1 in 1,000 people with epilepsy die from SUDEP. This is the leading cause of death in people with uncontrolled seizures. Until further answers are available, the best way to prevent SUDEP is to lower your risk by controlling seizures. Research has found that people with all types of epilepsy that experience convulsive seizures can be at risk.  Please make sure you are staying well hydrated. I recommend 50-60 ounces daily. Well balanced diet and regular exercise encouraged. Consistent sleep schedule with 6-8 hours recommended.   Please continue follow up with care team as directed.   Follow up with me in 6-12 months   You may receive a survey regarding today's visit. I encourage you to leave honest  feed back as I do use this information to improve patient care. Thank you for seeing me today!

## 2023-04-29 NOTE — Progress Notes (Unsigned)
PATIENT: Gregory Spence DOB: May 21, 1956  REASON FOR VISIT: follow up HISTORY FROM: patient  No chief complaint on file.     HISTORY OF PRESENT ILLNESS:  04/29/23 ALL: Gregory Spence returns for an acute visit following breakthrough seizure activity and concerns of carbamazepine toxicity. He was seen by me in the office 04/17/23 after home health NP had visited with concerns of him not taking medications consistently. He reported to me that he was taking carbamazepine 500mg  at 8am and 8pm, everyday. We advised he continue current treatment plan. Labs revealed cbz level was low and we advised his brother to make sure he was taking medicaitons as prescribed.   He was seen 04/20/2023 by [facility]. Brother found him down. He was awake and speaking but unable to stand due to weakness and unsteady gait. Patient assumed he had a seizure. He reported restarting cbx 2 days prior after being without medication for a few weeks. CBZ level 19.3. He was switched to levetiracetam. CBZ level 7.1 at discharge. CT head and c-spine negative. He was advised to stop cbz and continue lev 500mg  BID.   Since,   04/17/2023 ALL:  Gregory Spence returns for follow up for seizures. He was last seen 03/2021 and doing well on carbamazepine 500mg  BID. Per Epic review, he was seen 04/02/2023 by Romilda Garret, NP with [facility] for a annual home visit and Aldon reported not taking carbamezapine. PCP sent referral back to our office.   He presents with his brother, Duffy Rhody [facility]). He has a sister, Victorino Dike [facility]) and brother Richard([place]) who help take care of him as well. He lives with twin brother, [name]). Neither Iantha Fallen or [address] drive. Octave reports he is doing well. He has not had any recent seizure activity. Dat reports last seizure was 5-6 years ago. Per our documentation, last seizure in 1990. He reports taking carbamazepine daily at 8am and 8pm. He is tolerating it well. No recent seizure activity. He is  followed regularly by [name].   04/10/2021 ALL: Gregory Spence returns for follow up for seizures. He continues carbamazepine 500mg  BID. He is tolerating well and without obvious adverse effects. He denies seizure activity since last being seen. He is living with his twin brother [name]. His sister recently moved back to [place]. He is able to manage his medications. He does not drive. He is here with his friend, today.   He was seen in the ER 01/26/2021 following a fall in the setting of weakness from diarrhea x 1 week. No major injuries and workup was unremarkable. Na was 133. He has an appt with [name] 10/24 for follow up and blood work. He reports feeling much better and diarrhea is resolved.   10/01/2019 ALL:  Gregory Spence is a 67 y.o. male here today for follow up for seizures. He continues carbamazepine 500mg  twice daily. He is tolerating medication well with no obvious adverse effects. He denies seizure activity. He is seen yearly by PCP. No longer seeing hematology. He has received his first of two vaccines and anticipates getting the second on 4/19. He is living with is sister. He is feeling well today.   HISTORY: (copied from [name] note on 08/11/2018)  UPDATE (08/11/18, VRP): Since last visit, doing well. Symptoms are stable. No alleviating or aggravating factors. Tolerating CBZ. No seizures.   UPDATE (07/29/17, CM): 67 year old male returns for yearly follow-up. He reestablished care here with  Dr. Marjory Lies 09/25/2013. He had a previous patient of ours with Dr.  Hickling who has left the practice. 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 Last seizure event occurred September 1990. He also has a congenital left hemiparesis. Denies any daytime drowsiness headaches visual disturbance no falls. Denies missing any doses of his medications. He returns for reevaluation. Mother passed away in 06/15/14. She was primary caretaker. CBC  followed through oncology for his anemia. He needs liver function testing and refills. He has no new complaints.  He reports appetite is good.    PRIOR HPI (09/25/13, VRP): 67 year old male with history of mild mental retardation, static encephalopathy, mild congential left hemiparesis, seizure disorder, here to establish again for seizure disorder management.   Patient takes carbamazepine 200 mg tablets, 2.5 tablets twice a day. Patient has not had a seizure in over 20 years (last seizure 1990?). Otherwise patient is doing well. He continues to work for [employer] [place].   First seizure of life around age 73 years old, with generalized convulsive seizures. No warning sign for seizures. Patient was initially on Dilantin and then transition to carbamazepine. No seizure since starting carbamazepine.   REVIEW OF SYSTEMS: Out of a complete 14 system review of symptoms, the patient complains only of the following symptoms, none and all other reviewed systems are negative.  ALLERGIES: No Known Allergies  HOME MEDICATIONS: Outpatient Medications Prior to Visit  Medication Sig Dispense Refill   atenolol (TENORMIN) 25 MG tablet Take 1 tablet (25 mg total) by mouth daily. 90 tablet 0   atorvastatin (LIPITOR) 80 MG tablet TAKE 1 TABLET BY MOUTH EVERY DAY NEED APPT FOR MORE REFILLS  0   levETIRAcetam (KEPPRA) 500 MG tablet Take 1 tablet (500 mg total) by mouth 2 (two) times daily. 180 tablet 0   No facility-administered medications prior to visit.    PAST MEDICAL HISTORY: Past Medical History:  Diagnosis Date   Anemia    Hypertension    Seizures (HCC)     PAST SURGICAL HISTORY: No past surgical history on file.  FAMILY HISTORY: Family History  Problem Relation Age of Onset   Heart disease Mother    Diabetes Mother    Other Mother        infection   Colon cancer Sister    Colon cancer Brother    Colon polyps Neg Hx    Esophageal cancer Neg Hx     Stomach cancer Neg Hx    Rectal cancer Neg Hx    Seizures Neg Hx     SOCIAL HISTORY: Social History   Socioeconomic History   Marital status: Single    Spouse name: Not on file   Number of children: 0   Years of education: 12th   Highest education level: Not on file  Occupational History    Employer: [employer]  Tobacco Use   Smoking status: Never   Smokeless tobacco: Never  Vaping Use   Vaping status: Never Used  Substance and Sexual Activity   Alcohol use: No   Drug use: No   Sexual activity: Not on file  Other Topics Concern   Not on file  Social History Narrative   Patient lives at home with father, twin brother.    Soda every now and then   Never used EtOH, tobacco or illicit drugs.   Attended special education classes; graduated from 12th grade.   Social Determinants of Health   Financial Resource Strain: Not on file  Food Insecurity: No Food Insecurity (04/21/2023)   Hunger Vital Sign    Worried About Running Out of Food in the Last Year: Never true    Ran Out of Food in the Last Year: Never true  Transportation Needs: No Transportation Needs (04/21/2023)   PRAPARE - Administrator, Civil Service (Medical): No    Lack of Transportation (Non-Medical): No  Physical Activity: Not on file  Stress: Not on file  Social Connections: Not on file  Intimate Partner Violence: Not At Risk (04/21/2023)   Humiliation, Afraid, Rape, and Kick questionnaire    Fear of Current or Ex-Partner: No    Emotionally Abused: No    Physically Abused: No    Sexually Abused: No      PHYSICAL EXAM  There were no vitals filed for this visit.   There is no height or weight on file to calculate BMI.   Generalized: Well developed, in no acute distress  Cardiology: normal rate and rhythm, no murmur noted Respiratory: clear to auscultation bilaterally  Neurological examination  Mentation: Alert, Oriented to self, place and time. Slow responses, Mild  MR. Follows all commands speech and language fluent Cranial nerve II-XII: Pupils were equal round reactive to light. Extraocular movements were full, visual field were full on confrontational test. Facial sensation and strength were normal.  Head turning and shoulder shrug  were normal and symmetric. Motor: The motor testing reveals 5 over 5 strength of all 4 extremities. Good symmetric motor tone is noted throughout.  Sensory: Sensory testing is intact to soft touch on all 4 extremities. No evidence of extinction is noted.  Coordination: Cerebellar testing reveals good finger-nose-finger and heel-to-shin bilaterally.  Gait and station: Gait is narrow but stable.    DIAGNOSTIC DATA (LABS, IMAGING, TESTING) - I reviewed patient records, labs, notes, testing and imaging myself where available.      No data to display           Lab Results  Component Value Date   WBC 7.3 04/21/2023   HGB 11.9 (L) 04/21/2023   HCT 37.1 (L) 04/21/2023   MCV 91.8 04/21/2023   PLT 226 04/21/2023      Component Value Date/Time   NA 142 04/21/2023 0424   NA 142 04/17/2023 0828   NA 140 12/17/2013 1026   K 3.8 04/21/2023 0424   K 4.1 12/17/2013 1026   CL 108 04/21/2023 0424   CO2 24 04/21/2023 0424   CO2 27 12/17/2013 1026   GLUCOSE 96 04/21/2023 0424   GLUCOSE 91 12/17/2013 1026   BUN 6 (L) 04/21/2023 0424   BUN 15 04/17/2023 0828   BUN 6.8 (L) 12/17/2013 1026   CREATININE 0.86 04/21/2023 0424   CREATININE 1.0 12/17/2013 1026   CALCIUM 8.8 (L) 04/21/2023 0424   CALCIUM 9.3 12/17/2013 1026   PROT 6.9 04/20/2023 2145   PROT 7.3 04/17/2023 0828   PROT 7.6 12/17/2013 1026   ALBUMIN 3.5 04/20/2023 2145   ALBUMIN 4.5 04/17/2023 0828   ALBUMIN 3.9 12/17/2013 1026   AST 34 04/20/2023 2145   AST 25 12/17/2013 1026   ALT 34 04/20/2023 2145   ALT 20 12/17/2013 1026   ALKPHOS 67 04/20/2023 2145   ALKPHOS 83 12/17/2013 1026   BILITOT 1.1 04/20/2023 2145   BILITOT 1.1 04/17/2023 0828   BILITOT  0.26 12/17/2013 1026   GFRNONAA >60 04/21/2023 0424   GFRAA 96 10/01/2019 0814   No results found for: "CHOL", "HDL", "LDLCALC", "LDLDIRECT", "TRIG", "CHOLHDL"  No results found for: "HGBA1C" Lab Results  Component Value Date   VITAMINB12 554 12/17/2013   Lab Results  Component Value Date   TSH 0.304 (L) 04/21/2023       ASSESSMENT AND PLAN 67 y.o. year old male  has a past medical history of Anemia, Hypertension, and Seizures (HCC). here with   No diagnosis found.    Nikai is doing well, today. Unclear if he had breakthrough seizure but cbz level was toxic. Concerns raised on patient's ability to dose medications independently as he has in the past. We will continue levetiracetam 500mg  BID. We will update labs, today. Refills sent to pharmacy. He was encouraged to stay well hydrated and work on healthy lifestyle habits with well balanced diet and regular exercise. He will follow up with me in 1 year, sooner if needed.    No orders of the defined types were placed in this encounter.     No orders of the defined types were placed in this encounter.     I spent 30 minutes of face-to-face and non-face-to-face time with patient.  This included previsit chart review, lab review, study review, order entry, electronic health record documentation, patient education.   Shawnie Dapper, FNP-C 04/29/2023, 8:06 AM [facility] [address] [place], [place] [address] [phone]

## 2023-05-01 ENCOUNTER — Encounter: Payer: Self-pay | Admitting: Family Medicine

## 2023-05-01 ENCOUNTER — Ambulatory Visit (INDEPENDENT_AMBULATORY_CARE_PROVIDER_SITE_OTHER): Payer: 59 | Admitting: Family Medicine

## 2023-05-01 VITALS — BP 117/69 | HR 79 | Ht 74.0 in | Wt 157.4 lb

## 2023-05-01 DIAGNOSIS — G40909 Epilepsy, unspecified, not intractable, without status epilepticus: Secondary | ICD-10-CM

## 2023-05-01 MED ORDER — LEVETIRACETAM 500 MG PO TABS: 500.0000 mg | ORAL_TABLET | Freq: Two times a day (BID) | ORAL | 3 refills | Status: DC

## 2023-12-05 ENCOUNTER — Telehealth: Payer: Self-pay | Admitting: Family Medicine

## 2023-12-05 NOTE — Telephone Encounter (Signed)
 Pt's brother is asking for a call to discuss medications pt is currently taking.

## 2023-12-09 NOTE — Telephone Encounter (Addendum)
 Pt last seen 05/01/23 by AL,NP. Dx: Seizures. Confirmed brother on pt DPR. I called and spoke w/ Arvel Lather. He states they met with caretaker for their annual meet and they wanted to verify which medication he should be taking. Explained he should be taking levetiracetam  500mg  BID. Refills on file at [id number] - [place], [place] - [address] He verbalized understanding and appreciation.

## 2023-12-13 ENCOUNTER — Observation Stay (HOSPITAL_COMMUNITY)
Admission: EM | Admit: 2023-12-13 | Discharge: 2023-12-14 | Disposition: A | Discharge status: Home / Self Care | Attending: Internal Medicine | Admitting: Internal Medicine

## 2023-12-13 ENCOUNTER — Other Ambulatory Visit: Payer: Self-pay

## 2023-12-13 ENCOUNTER — Observation Stay (HOSPITAL_COMMUNITY)

## 2023-12-13 ENCOUNTER — Encounter (HOSPITAL_COMMUNITY): Payer: Self-pay | Admitting: Emergency Medicine

## 2023-12-13 ENCOUNTER — Emergency Department (HOSPITAL_COMMUNITY)

## 2023-12-13 DIAGNOSIS — D649 Anemia, unspecified: Secondary | ICD-10-CM | POA: Diagnosis not present

## 2023-12-13 DIAGNOSIS — Z79899 Other long term (current) drug therapy: Secondary | ICD-10-CM | POA: Diagnosis not present

## 2023-12-13 DIAGNOSIS — E785 Hyperlipidemia, unspecified: Secondary | ICD-10-CM

## 2023-12-13 DIAGNOSIS — G40409 Other generalized epilepsy and epileptic syndromes, not intractable, without status epilepticus: Secondary | ICD-10-CM | POA: Diagnosis not present

## 2023-12-13 DIAGNOSIS — I10 Essential (primary) hypertension: Secondary | ICD-10-CM

## 2023-12-13 DIAGNOSIS — E871 Hypo-osmolality and hyponatremia: Secondary | ICD-10-CM | POA: Diagnosis not present

## 2023-12-13 DIAGNOSIS — W1830XA Fall on same level, unspecified, initial encounter: Secondary | ICD-10-CM | POA: Diagnosis present

## 2023-12-13 DIAGNOSIS — E860 Dehydration: Secondary | ICD-10-CM | POA: Diagnosis present

## 2023-12-13 DIAGNOSIS — R42 Dizziness and giddiness: Secondary | ICD-10-CM | POA: Diagnosis present

## 2023-12-13 DIAGNOSIS — W19XXXA Unspecified fall, initial encounter: Principal | ICD-10-CM

## 2023-12-13 DIAGNOSIS — R569 Unspecified convulsions: Secondary | ICD-10-CM

## 2023-12-13 DIAGNOSIS — G40909 Epilepsy, unspecified, not intractable, without status epilepticus: Secondary | ICD-10-CM | POA: Diagnosis not present

## 2023-12-13 DIAGNOSIS — D72829 Elevated white blood cell count, unspecified: Secondary | ICD-10-CM

## 2023-12-13 LAB — CBC WITH DIFFERENTIAL/PLATELET
Abs Immature Granulocytes: 0.03 10*3/uL (ref 0.00–0.07)
Basophils Absolute: 0 10*3/uL (ref 0.0–0.1)
Basophils Relative: 0 %
Eosinophils Absolute: 0.1 10*3/uL (ref 0.0–0.5)
Eosinophils Relative: 1 %
HCT: 37.5 % — ABNORMAL LOW (ref 39.0–52.0)
Hemoglobin: 12.5 g/dL — ABNORMAL LOW (ref 13.0–17.0)
Immature Granulocytes: 0 %
Lymphocytes Relative: 6 %
Lymphs Abs: 0.6 10*3/uL — ABNORMAL LOW (ref 0.7–4.0)
MCH: 29.5 pg (ref 26.0–34.0)
MCHC: 33.3 g/dL (ref 30.0–36.0)
MCV: 88.4 fL (ref 80.0–100.0)
Monocytes Absolute: 1.1 10*3/uL — ABNORMAL HIGH (ref 0.1–1.0)
Monocytes Relative: 10 %
Neutro Abs: 9.1 10*3/uL — ABNORMAL HIGH (ref 1.7–7.7)
Neutrophils Relative %: 83 %
Platelets: 276 10*3/uL (ref 150–400)
RBC: 4.24 MIL/uL (ref 4.22–5.81)
RDW: 12.5 % (ref 11.5–15.5)
WBC: 10.9 10*3/uL — ABNORMAL HIGH (ref 4.0–10.5)
nRBC: 0 % (ref 0.0–0.2)

## 2023-12-13 LAB — BASIC METABOLIC PANEL WITH GFR
Anion gap: 10 (ref 5–15)
Anion gap: 11 (ref 5–15)
BUN: 9 mg/dL (ref 8–23)
BUN: 8 mg/dL (ref 8–23)
CO2: 24 mmol/L (ref 22–32)
CO2: 23 mmol/L (ref 22–32)
Calcium: 9.1 mg/dL (ref 8.9–10.3)
Calcium: 8.9 mg/dL (ref 8.9–10.3)
Chloride: 93 mmol/L — ABNORMAL LOW (ref 98–111)
Chloride: 94 mmol/L — ABNORMAL LOW (ref 98–111)
Creatinine, Ser: 0.87 mg/dL (ref 0.61–1.24)
Creatinine, Ser: 0.95 mg/dL (ref 0.61–1.24)
GFR, Estimated: >60 mL/min (ref >60)
GFR, Estimated: >60 mL/min (ref >60)
Glucose, Bld: 117 mg/dL — ABNORMAL HIGH (ref 70–99)
Glucose, Bld: 100 mg/dL — ABNORMAL HIGH (ref 70–99)
Potassium: 4.6 mmol/L (ref 3.5–5.1)
Potassium: 4.2 mmol/L (ref 3.5–5.1)
Sodium: 127 mmol/L — ABNORMAL LOW (ref 135–145)
Sodium: 128 mmol/L — ABNORMAL LOW (ref 135–145)

## 2023-12-13 LAB — CARBAMAZEPINE LEVEL, TOTAL: Carbamazepine Lvl: 14.7 ug/mL — ABNORMAL HIGH (ref 4.0–12.0)

## 2023-12-13 MED ORDER — CARBAMAZEPINE 100 MG PO CHEW
100.0000 mg | CHEWABLE_TABLET | Freq: Two times a day (BID) | ORAL | Status: DC
  Administered 2023-12-13 – 2023-12-14 (×2): 100 mg via ORAL
  Filled 2023-12-13 (×3): qty 1

## 2023-12-13 MED ORDER — SODIUM CHLORIDE 0.9 % IV BOLUS
1000.0000 mL | Freq: Once | INTRAVENOUS | Status: AC
  Administered 2023-12-13: 1000 mL via INTRAVENOUS

## 2023-12-13 MED ORDER — SODIUM CHLORIDE 0.9 % IV BOLUS
500.0000 mL | Freq: Once | INTRAVENOUS | Status: AC
Start: 2023-12-13 — End: 2023-12-13
  Administered 2023-12-13: 500 mL via INTRAVENOUS

## 2023-12-13 MED ORDER — CARBAMAZEPINE 200 MG PO TABS
500.0000 mg | ORAL_TABLET | Freq: Two times a day (BID) | ORAL | Status: DC
  Filled 2023-12-13 (×2): qty 2.5

## 2023-12-13 MED ORDER — CARBAMAZEPINE 200 MG PO TABS
400.0000 mg | ORAL_TABLET | Freq: Two times a day (BID) | ORAL | Status: DC
  Administered 2023-12-13 – 2023-12-14 (×2): 400 mg via ORAL
  Filled 2023-12-13 (×3): qty 2

## 2023-12-13 MED ORDER — SODIUM CHLORIDE 0.9 % IV SOLN: INTRAVENOUS | Status: DC

## 2023-12-13 MED ORDER — RIVAROXABAN 10 MG PO TABS
10.0000 mg | ORAL_TABLET | Freq: Every day | ORAL | Status: DC
  Administered 2023-12-13 – 2023-12-14 (×2): 10 mg via ORAL
  Filled 2023-12-13 (×2): qty 1

## 2023-12-13 MED ORDER — ATORVASTATIN CALCIUM 80 MG PO TABS
80.0000 mg | ORAL_TABLET | Freq: Every day | ORAL | Status: DC
  Administered 2023-12-13: 80 mg via ORAL
  Filled 2023-12-13: qty 1

## 2023-12-13 NOTE — Hospital Course (Addendum)
#  Fall #Lightheadedness Patient presents after falling due to lightheadedness, which is concerning for orthostatic hypotension. He is volume down on exam with dry mucous membranes and prolonged capillary refill. Orthostatics obtained were WNL, but were collected after IVF was administered. Other sources of fall were evaluated. EEG did not show a seizure and Echocardiogram did not reveal aortic stenosis. He received 3.5 L of IVF and had resolution to his symptoms on the day of discharge which further supports dehydration causing orthostatic hypotension which led to the fall.   #Hyponatremia Initial workup with Na 127. Suspect hypovolemic hypotonic hyponatremia since calculated serum osmolality 264 mOsm/kg, and patient is volume down on exam with dry mucous membranes and prolonged capillary refill. Sodium improved to 136 on day of discharge after IVF administration which supports hypovolemic hypotonic hyponatremia.  Hyperdynamic, Increased flow velocities on echocardiogram Most likely from dehydration. TSH was WNL, T4 is process.   #Normocytic anemia Patient chronically has a normocytic anemia with baseline Hgb 11-12, which is stable at Hgb 12.5 on admission. Iron studies were not confirmatory of iron deficiency.   #Seizure Disorder  Spot EEG did not reveal seizure.He was continued on his carbamazepine  500 mg BID.

## 2023-12-13 NOTE — ED Triage Notes (Signed)
 Pt BIB [facility] from home due to fall.  Pt fell in kitchen on right side no head trauma per brother.  No report of any pain currently.  Pt is at baseline mentation.  Hx seizure.  No seizure activity with fall.   VS BP 130/70, HR 97, SpO2 97% RA CBG 137

## 2023-12-13 NOTE — Progress Notes (Signed)
EEG complete. Results pending.  ?

## 2023-12-13 NOTE — ED Provider Notes (Addendum)
 [place] EMERGENCY DEPARTMENT AT [facility] Provider Note   CSN: [id number] Arrival date & time: 12/13/23  2952     Patient presents with: Gregory Spence is a 68 y.o. male.   Patient brought in by EMS.  Patient states he was walking into the kitchen to get a cup of coffee he felt dizzy and then he fell.  Patient has history of seizures but apparently no seizure activity.  Patient without any complaints currently.  Patient not on blood thinners.  Patient is on Keppra  and is on a beta-blocker.  Past medical history significant for seizures hypertension and anemia.  Patient does not use tobacco products.  Patient lives at home with his brother.  His brother called EMS.  Vital signs upon arrival blood pressure 130/70 heart rate 97 oxygen saturation is 97% blood sugar 137.       Prior to Admission medications   Medication Sig Start Date End Date Taking? Authorizing Provider  atenolol  (TENORMIN ) 25 MG tablet Take 1 tablet (25 mg total) by mouth daily. 04/23/23 07/22/23  Unk Garb, DO  atorvastatin  (LIPITOR) 80 MG tablet TAKE 1 TABLET BY MOUTH EVERY DAY NEED APPT FOR MORE REFILLS 06/03/15   [provider]  levETIRAcetam  (KEPPRA ) 500 MG tablet Take 1 tablet (500 mg total) by mouth 2 (two) times daily. 05/01/23 04/25/24  Lomax, Amy, NP    Allergies: Patient has no known allergies.    Review of Systems  Constitutional:  Negative for chills and fever.  HENT:  Negative for ear pain and sore throat.   Eyes:  Negative for pain and visual disturbance.  Respiratory:  Negative for cough and shortness of breath.   Cardiovascular:  Negative for chest pain and palpitations.  Gastrointestinal:  Negative for abdominal pain and vomiting.  Genitourinary:  Negative for dysuria and hematuria.  Musculoskeletal:  Negative for arthralgias, back pain and neck pain.  Skin:  Negative for color change and rash.  Neurological:  Negative for seizures, syncope and headaches.  All other  systems reviewed and are negative.   Updated Vital Signs BP 128/76 (BP Location: Left Arm)   Pulse 98   Temp 98.9 F (37.2 C) (Oral)   Resp 20   Ht 1.88 m (6' 2)   Wt 72 kg   SpO2 100%   BMI 20.38 kg/m   Physical Exam Vitals and nursing note reviewed.  Constitutional:      General: He is not in acute distress.    Appearance: Normal appearance. He is well-developed.  HENT:     Head: Normocephalic and atraumatic.     Mouth/Throat:     Mouth: Mucous membranes are moist.   Eyes:     Extraocular Movements: Extraocular movements intact.     Conjunctiva/sclera: Conjunctivae normal.     Pupils: Pupils are equal, round, and reactive to light.    Cardiovascular:     Rate and Rhythm: Normal rate and regular rhythm.     Heart sounds: No murmur heard. Pulmonary:     Effort: Pulmonary effort is normal. No respiratory distress.     Breath sounds: Normal breath sounds.  Abdominal:     Palpations: Abdomen is soft.     Tenderness: There is no abdominal tenderness.   Musculoskeletal:        General: No swelling.     Cervical back: Normal range of motion and neck supple. No rigidity or tenderness.     Right lower leg: No edema.  Left lower leg: No edema.   Skin:    General: Skin is warm and dry.     Capillary Refill: Capillary refill takes less than 2 seconds.   Neurological:     General: No focal deficit present.     Mental Status: He is alert and oriented to person, place, and time.   Psychiatric:        Mood and Affect: Mood normal.     (all labs ordered are listed, but only abnormal results are displayed) Labs Reviewed  CBC WITH DIFFERENTIAL/PLATELET  BASIC METABOLIC PANEL WITH GFR    EKG: None  Radiology: No results found.   Procedures   Medications Ordered in the ED - No data to display                                  Medical Decision Making Amount and/or Complexity of Data Reviewed Labs: ordered. Radiology: ordered.   Patient without any  complaints.  Did complain of feeling dizzy at home.  Based on this we will get CT head not clear whether he hit his head when he went down or not.  No seizure activity.  Vital signs very reassuring blood sugar was good.  Will get EKG basic labs head CT.  If negative probably can be discharged back home.  Patient not on blood thinners.  Patient CBC white count 10.9 hemoglobin 12.5 platelets 276.  Patient metabolic panel sodium low at 127 chloride 93 renal function GFR is greater than 60.  CT head no evidence intercranial abnormality.  The hyponatremia could be significant.  May require admission for that.  Will start IV fluids.    Final diagnoses:  Fall, initial encounter    ED Discharge Orders     None          Nicklas Barns, MD 12/13/23 0981    Nicklas Barns, MD 12/13/23 1258

## 2023-12-13 NOTE — Procedures (Signed)
 Patient Name: Gregory Spence  MRN: [id number]  Epilepsy Attending: Arleene Lack  Referring Physician/Provider: Aurora Lees, DO  Date: 12/13/2023 Duration: 25.21 mins  Patient history: 68 year-old male with PMH of seizure disorder, HTN, HLD, and mild intellectual disability who presents for lightheadedness and a fall. EEG to evaluate for seizure.  Level of alertness: Awake, drowsy  AEDs during EEG study: CBZ  Technical aspects: This EEG study was done with scalp electrodes positioned according to the 10-20 International system of electrode placement. Electrical activity was reviewed with band pass filter of 1-70Hz , sensitivity of 7 uV/mm, display speed of 48mm/sec with a 60Hz  notched filter applied as appropriate. EEG data were recorded continuously and digitally stored.  Video monitoring was available and reviewed as appropriate.  Description: The posterior dominant rhythm consists of 8 Hz activity of moderate voltage (25-35 uV) seen predominantly in posterior head regions, symmetric and reactive to eye opening and eye closing. Drowsiness was characterized by attenuation of the posterior background rhythm. EEG showed intermittent generalized 3 to 6 Hz theta-delta slowing. Physiologic photic driving was not seen during photic stimulation.  Hyperventilation was not performed.     ABNORMALITY - Intermittent slow, generalized  IMPRESSION: This study is suggestive of mild diffuse encephalopathy. No seizures or epileptiform discharges were seen throughout the recording.   Tahje Borawski O Ferlando Lia

## 2023-12-13 NOTE — H&P (Cosign Needed Addendum)
 Date: 12/13/2023               Patient Name:  Gregory Spence MRN: [id number]  DOB: March 29, 1956 Age / Sex: 68 y.o., male   PCP: Jacqulyne Maxim, MD              Medical Service: Internal Medicine Teaching Service              Attending Physician: Dr. Sandie Cross, MD    First Contact: Darletta Ehrich, MS4    Second Contact: Dr. Aurora Lees    Third Contact Dr. Saad Nooruddin         After Hours (After 5p/  First Contact Pager: [phone]  weekends / holidays): Second Contact Pager: [phone]   Chief Complaint: lightheadedness and fall  History of Present Illness:  Gregory Spence is a 68 year-old male with PMH of seizure disorder, HTN, HLD, and mild intellectual disability who presents for lightheadedness and a fall.  Patient reports he was walking to the kitchen this morning to fix his coffee when he became dizzy, causing him to fall. His twin brother witnessed the incident and called EMS. The patient did not lose consciousness or experience any seizure-like activity during this episode. He can recall everything that happened. He has previously experienced episodes like this, once from a seizure and another time from orthostatic hypotension.  The story was confirmed with the brother on scene.   Patient otherwise has been in his normal state of health. His appetite has been decreased for the past several days, which happens anytime the weather is hot. He only ate one hot dog yesterday with minimal fluid intake. He also reports intermittent diarrhea. He denies headache, vision changes, nasal congestion, sore throat, dysphagia, cough, shortness of breath, chest pain, palpitations, abdominal pain, nausea, vomiting, or dysuria.   Upon presentation the ED, patient was hemodynamically stable on room air. Workup was notable for Na 127, Cl 93, Cr 0.87, K 4.6, WBC 10.9, Hgb 12.5, EKG NSR, and CT head without acute intracranial abnormalities. He received IV NS 500 mL bolus x1.  Meds: Current  Meds  Medication Sig   atorvastatin  (LIPITOR) 80 MG tablet TAKE 1 TABLET BY MOUTH EVERY DAY NEED APPT FOR MORE REFILLS   carbamazepine  (TEGRETOL ) 200 MG tablet Take 500 mg by mouth 2 (two) times daily.   Allergies: Allergies as of 12/13/2023   (No Known Allergies)   Past Medical History:  Diagnosis Date   Anemia    Hypertension    Seizures (HCC)     Family History: No significant medical history in parents or siblings per brother   Social History:  Patient lives with his twin brother. 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 He is independent with iADLs/ADLs. He denies tobacco, alcohol, or recreational drug use. His emergency contact is Kaye Mitro (brother: [phone]).  Review of Systems: A complete ROS was negative except as per HPI.  Physical Exam: Blood pressure 128/70, pulse 88, temperature 99.2 F (37.3 C), temperature source Oral, resp. rate (!) 22, height 6' 2 (1.88 m), weight 72 kg, SpO2 100%. General: Well-appearing older male sitting upright in hospital bed with brother at bedside, appears comfortable and in NAD HEENT: Normocephalic and atraumatic. Conjunctivae clear. PERRL. No nasal congestion or rhinorrhea. Oropharynx without lesions, erythema, or exudate. No tongue lacerations. Poor dentition. Mucous membranes very dry. Neck: Normal range of motion. No palpable lymphadenopathy or masses. Cardiovascular: Normal rate with regular rhythm. No murmurs, rubs,  or gallops. No LE edema.  Pulmonary: Lungs CTAB. No wheezes, rales, or rhonchi. Normal respiratory effort on room air. Abdominal: Soft. Non-distended. No tenderness to palpation. Normal bowel sounds.    Neurological: Alert and oriented to person, place, and time. Non-focal. Skin: Warm and dry, delayed capillary refill (3+ seconds), skin tenting present     EKG: personally reviewed my interpretation is NSR.  Assessment & Plan by Problem: Principal Problem:   Fall Active Problems:    Hyponatremia  Gregory Spence is a 68 year-old male with PMH of seizure disorder, HTN, HLD, and mild intellectual disability who presents for lightheadedness and a fall and admitted on 12/13/2023 for further workup.  #Fall #Lightheadedness Patient presents after falling due to lightheadedness, which is concerning for orthostatic hypotension. He is volume down on exam with dry mucous membranes and prolonged capillary refill. Will give additional 1L NS bolus and check orthostatic vital signs. Other differentials include seizure, arrhythmia, valvular disease, and vertigo. Do not think patient had seizure given lack of reported seizure-like activity and no tongue lacerations on exam, but will obtain spot EEG since patient has a seizure disorder. Do not think patient has an arrhythmia or valvular disease since he denies palpitations or chest pain, but will obtain echocardiogram and monitor on telemetry for 24 hours. Do not think patient has vertigo since he describes lightheadedness/pre-syncope instead of dizziness/room-spinning, so will hold off on any interventions for now. Plan: - 1L NS bolus x1 - Orthostatic vital signs - Place TED hose - Spot EEG - Telemetry x 24 hours - Encourage Oral intake   #Hyponatremia Initial workup with Na 127. Suspect hypovolemic hypotonic hyponatremia since calculated serum osmolality 264 mOsm/kg, and patient is volume down on exam with dry mucous membranes and prolonged capillary refill. Will start treatment with fluid resuscitation before pursuing further workup and monitor BMPs for improvement. Do not think the hyponatremia caused his fall but instead dehydration caused both his fall and hyponatremia. - s/p NS 500 mL bolus x1 - 1L NS bolus x1 - Repeat BMP @ 1800 - Repeat BMP tomorrow AM  #Seizure Disorder Patient reports no seizure-like activity since 03/2023. He follows with [facility] and was last seen in 04/2023. Will continue home  carbamazepine  500 mg BID while hospitalized. Obtaining spot EEG as above to rule out etiology of fall. - Home carbamazepine  500 mg BID - Spot EEG  #Normocytic anemia Patient chronically has a normocytic anemia with baseline Hgb 11-12, which is stable at Hgb 12.5 on admission. Last iron studies in 11/2013 with with normal TIBC (224), normal serum iron (121), normal TSAT (54%), and elevated ferritin (470). Will recheck iron studies for now. - Iron, TIBC, and ferritin  #Leukocytosis  Suspect that this is due to hemoconcentration from dehydration. Less likely due to infection with lack of infectious symptoms and he is afebrile. -CBC tomorrow   #Hypertension Blood pressure stable with BP 128/70. Per chart review, patient has previously been prescribed atenolol  for hypertension, but his hypertension is currently diet-controlled.   #Hyperlipidemia He has also been diagnosed with hyperlipidemia for which he takes atorvastatin  80 mg daily, which we will continue while hospitalized. - Home atorvastatin  80 mg daily  Dispo: Admit patient to Observation with expected length of stay less than 2 midnights.    Attestation for Student Documentation:  I personally was present and performed or re-performed the history, physical exam and medical decision-making activities of this service and have verified that the service and findings are  accurately documented in the student's note.  Aurora Lees, DO 12/13/2023, 3:18 PM

## 2023-12-13 NOTE — Progress Notes (Incomplete)
 Internal Medicine Teaching Service Attending Note Date: 12/13/2023  Patient name: Gregory Spence  Medical record number: [id number]  Date of birth: 01-Sep-1955   I have seen and evaluated JAMIL ARMWOOD and discussed their care with the Residency Team.   68 yo M with hx of seizure disorder, last sz Oct 2024, this am got out of bed and felt that the room was spinning. He fell and was noted to be unconcsious by his brother. The length the time he was down as well as unconscious is unknown.  He denies preceding headaches, CP, SOB.  He relates decreased po recently- decreased apetite. He ate 2 hot dogs yesterday and nothing since.  ROS Denies: dysphagia, cough, abd pain, diarrhea.  Soc- no ETOH/Tobacco/rec drugs FHx- parents deceased, unknown cause.   Physical Exam: Blood pressure 128/70, pulse 88, temperature 99.2 F (37.3 C), temperature source Oral, resp. rate (!) 22, height 6' 2 (1.88 m), weight 72 kg, SpO2 100%. General appearance: alert, cooperative, no distress, and slowed mentation Eyes: negative findings: conjunctivae and sclerae normal and pupils equal, round, reactive to light and accomodation Throat: normal findings: oropharynx pink & moist without lesions or evidence of thrush Neck: no adenopathy and supple, symmetrical, trachea midline Resp: clear to auscultation bilaterally Cardio: regular rate and rhythm GI: normal findings: bowel sounds normal and soft, non-tender Extremities: edema none  Lab results: Results for orders placed or performed during the hospital encounter of 12/13/23 (from the past 24 hours)  CBC with Differential/Platelet     Status: Abnormal   Collection Time: 12/13/23 10:23 AM  Result Value Ref Range   WBC 10.9 (H) 4.0 - 10.5 K/uL   RBC 4.24 4.22 - 5.81 MIL/uL   Hemoglobin 12.5 (L) 13.0 - 17.0 g/dL   HCT 40.9 (L) 81.1 - 91.4 %   MCV 88.4 80.0 - 100.0 fL   MCH 29.5 26.0 - 34.0 pg   MCHC 33.3 30.0 - 36.0 g/dL   RDW 78.2 95.6 - 21.3 %   Platelets 276  150 - 400 K/uL   nRBC 0.0 0.0 - 0.2 %   Neutrophils Relative % 83 %   Neutro Abs 9.1 (H) 1.7 - 7.7 K/uL   Lymphocytes Relative 6 %   Lymphs Abs 0.6 (L) 0.7 - 4.0 K/uL   Monocytes Relative 10 %   Monocytes Absolute 1.1 (H) 0.1 - 1.0 K/uL   Eosinophils Relative 1 %   Eosinophils Absolute 0.1 0.0 - 0.5 K/uL   Basophils Relative 0 %   Basophils Absolute 0.0 0.0 - 0.1 K/uL   Immature Granulocytes 0 %   Abs Immature Granulocytes 0.03 0.00 - 0.07 K/uL  Basic metabolic panel with GFR     Status: Abnormal   Collection Time: 12/13/23 10:23 AM  Result Value Ref Range   Sodium 127 (L) 135 - 145 mmol/L   Potassium 4.6 3.5 - 5.1 mmol/L   Chloride 93 (L) 98 - 111 mmol/L   CO2 24 22 - 32 mmol/L   Glucose, Bld 117 (H) 70 - 99 mg/dL   BUN 9 8 - 23 mg/dL   Creatinine, Ser 0.86 0.61 - 1.24 mg/dL   Calcium  9.1 8.9 - 10.3 mg/dL   GFR, Estimated >57 >84 mL/min   Anion gap 10 5 - 15    Imaging results:  CT Head Wo Contrast Result Date: 12/13/2023 CLINICAL DATA:  Head trauma, fall. EXAM: CT HEAD WITHOUT CONTRAST TECHNIQUE: Contiguous axial images were obtained from the base of the skull through  the vertex without intravenous contrast. RADIATION DOSE REDUCTION: This exam was performed according to the departmental dose-optimization program which includes automated exposure control, adjustment of the mA and/or kV according to patient size and/or use of iterative reconstruction technique. COMPARISON:  CT head 04/20/2023. FINDINGS: Brain: No acute intracranial hemorrhage. No CT evidence of acute infarct. No edema, mass effect, or midline shift. The basilar cisterns are patent. Ventricles: The ventricles are normal. Vascular: Atherosclerotic calcifications of the carotid siphons. No hyperdense vessel. Skull: No acute or aggressive finding. Orbits: Orbits are symmetric. Sinuses: Mucosal thickening throughout the paranasal sinuses most pronounced in the ethmoid and maxillary sinuses. Other: Mastoid air cells are  clear. IMPRESSION: No CT evidence of acute intracranial abnormality. Paranasal sinus disease increased from prior. Electronically Signed   By: Denny Flack M.D.   On: 12/13/2023 10:25    Assessment and Plan: I agree with the formulated Assessment and Plan with the following changes:  Syncope HypoNatremia Will hydrate as he tolerates.  Will check trop Watch on tele Pt was noted to have seizure by his family. If further episodes will ask neuro to eval.  His CT head is no acute change, his ECG is unchanged.   Sandie Cross, MD

## 2023-12-13 NOTE — Plan of Care (Signed)
  Problem: Health Behavior/Discharge Planning: Goal: Ability to manage health-related needs will improve Outcome: Progressing   Problem: Clinical Measurements: Goal: Ability to maintain clinical measurements within normal limits will improve Outcome: Progressing Goal: Respiratory complications will improve Outcome: Progressing

## 2023-12-13 NOTE — ED Notes (Signed)
Patient taken to EEG 

## 2023-12-14 ENCOUNTER — Observation Stay (HOSPITAL_BASED_OUTPATIENT_CLINIC_OR_DEPARTMENT_OTHER)

## 2023-12-14 DIAGNOSIS — R55 Syncope and collapse: Secondary | ICD-10-CM | POA: Diagnosis not present

## 2023-12-14 DIAGNOSIS — E871 Hypo-osmolality and hyponatremia: Secondary | ICD-10-CM | POA: Diagnosis not present

## 2023-12-14 LAB — CBC
HCT: 38.4 % — ABNORMAL LOW (ref 39.0–52.0)
Hemoglobin: 12.8 g/dL — ABNORMAL LOW (ref 13.0–17.0)
MCH: 29.4 pg (ref 26.0–34.0)
MCHC: 33.3 g/dL (ref 30.0–36.0)
MCV: 88.3 fL (ref 80.0–100.0)
Platelets: 301 10*3/uL (ref 150–400)
RBC: 4.35 MIL/uL (ref 4.22–5.81)
RDW: 12.6 % (ref 11.5–15.5)
WBC: 6.7 10*3/uL (ref 4.0–10.5)
nRBC: 0 % (ref 0.0–0.2)

## 2023-12-14 LAB — ECHOCARDIOGRAM COMPLETE
AR max vel: 2.72 cm2
AV Area VTI: 3.04 cm2
AV Area mean vel: 2.58 cm2
AV Mean grad: 3 mmHg
AV Peak grad: 6.6 mmHg
Ao pk vel: 1.28 m/s
Area-P 1/2: 4.6 cm2
Calc EF: 75.6 %
Est EF: >75
Height: 74 in
S' Lateral: 2 cm
Single Plane A2C EF: 74.3 %
Single Plane A4C EF: 77.7 %
Weight: 2539.7 [oz_av]

## 2023-12-14 LAB — BASIC METABOLIC PANEL WITH GFR
Anion gap: 13 (ref 5–15)
BUN: 7 mg/dL — ABNORMAL LOW (ref 8–23)
CO2: 20 mmol/L — ABNORMAL LOW (ref 22–32)
Calcium: 9.1 mg/dL (ref 8.9–10.3)
Chloride: 103 mmol/L (ref 98–111)
Creatinine, Ser: 0.97 mg/dL (ref 0.61–1.24)
GFR, Estimated: >60 mL/min (ref >60)
Glucose, Bld: 106 mg/dL — ABNORMAL HIGH (ref 70–99)
Potassium: 4.2 mmol/L (ref 3.5–5.1)
Sodium: 136 mmol/L (ref 135–145)

## 2023-12-14 LAB — FERRITIN: Ferritin: 429 ng/mL — ABNORMAL HIGH (ref 24–336)

## 2023-12-14 LAB — IRON AND TIBC
Iron: 88 ug/dL (ref 45–182)
Saturation Ratios: 30 % (ref 17.9–39.5)
TIBC: 290 ug/dL (ref 250–450)
UIBC: 202 ug/dL

## 2023-12-14 LAB — T4, FREE: Free T4: 0.78 ng/dL (ref 0.61–1.12)

## 2023-12-14 LAB — TSH: TSH: 0.991 u[IU]/mL (ref 0.350–4.500)

## 2023-12-14 MED ORDER — LACTATED RINGERS IV BOLUS
1000.0000 mL | Freq: Once | INTRAVENOUS | Status: AC
  Administered 2023-12-14: 1000 mL via INTRAVENOUS

## 2023-12-14 NOTE — Progress Notes (Signed)
 Orthostatic vitals performed(see flow sheets for details)Pt has no complaints,  BP wise stable throughout the process. He remains alert/oriented in no apparent distress, denies any dizziness/discomfort at this time. Attending MD informed of above.

## 2023-12-14 NOTE — Progress Notes (Signed)
 Discharge summary (AVS) packet provided to pt&family with instructions.  Pt/family verbalized understanding of instructions. No complaints . Pt remains alert/oriented in no apparent distress. D/C to home as ordered Family is responsible for his transportation.

## 2023-12-14 NOTE — Plan of Care (Signed)
  Problem: Activity: Goal: Risk for activity intolerance will decrease Outcome: Progressing   Problem: Coping: Goal: Level of anxiety will decrease Outcome: Progressing   Problem: Pain Managment: Goal: General experience of comfort will improve and/or be controlled Outcome: Progressing   Problem: Safety: Goal: Ability to remain free from injury will improve Outcome: Progressing

## 2023-12-14 NOTE — Discharge Summary (Addendum)
Name: Gregory Spence MRN: [id number] DOB: 1955/09/27 68 y.o. PCP: Jolee Madelin Patch, MD  Date of Admission: 12/13/2023  9:28 AM Date of Discharge: 12/20/2023 12:30 PM Attending Physician: Dr. Trudy  Discharge Diagnosis: Principal Problem:   Ground-level fall Active Problems:   Dehydration with hyponatremia    Discharge Medications: Allergies as of 12/14/2023   No Known Allergies      Medication List     STOP taking these medications    ferrous sulfate 325 (65 FE) MG tablet       TAKE these medications    atorvastatin  80 MG tablet Commonly known as: LIPITOR TAKE 1 TABLET BY MOUTH EVERY DAY NEED APPT FOR MORE REFILLS   carbamazepine  200 MG tablet Commonly known as: TEGRETOL  Take 500 mg by mouth 2 (two) times daily.        Disposition and follow-up:   Gregory Spence was discharged from [facility] in Good condition.  At the hospital follow up visit please address:  1.  Follow-up:  *Fall due most likely due to orthostatic hypotension  -Assess patient's oral intake -Encourage oral intake -Repeat orthostatics in office   *Hyponatremia -Repeat BMP in office -Assess volume status   *Hyperdynamic, Increased flow velocities on echocardiogram Most likely from dehydration. TSH was WNL -Follow up T4   2.  Labs / imaging needed at time of follow-up: BMP, orthostatic vital signs   3.  Pending labs/ test needing follow-up: T4  4.  Medication Changes  STOPPED  -Ferrous sulfate (patient was not taking at home)   Hospital Course by problem list: #Fall #Lightheadedness Patient presents after falling due to lightheadedness, which is concerning for orthostatic hypotension. He is dehydrated on exam with dry mucous membranes and prolonged capillary refill. Orthostatics obtained were WNL, but were collected after IVF was administered. Other sources of fall were evaluated. EEG did not show a seizure and Echocardiogram did not reveal valve  abnormality or new wall motion abnormality. He received 3.5 L of IVF and had resolution to his symptoms on the day of discharge which further supports dehydration causing orthostatic hypotension which led to the fall.   #Hyponatremia Initial workup with Na 127. Suspect hypovolemic hypotonic hyponatremia since calculated serum osmolality 264 mOsm/kg, and patient was clinically dehydrated on exam with dry mucous membranes and prolonged capillary refill. Sodium improved to 136 on day of discharge after IVF administration which supports hypovolemic hypotonic hyponatremia.  Hyperdynamic, Increased flow velocities on echocardiogram Most likely from dehydration. TSH was WNL, T4 is process.   #Normocytic anemia Patient chronically has a normocytic anemia with baseline Hgb 11-12, which is stable at Hgb 12.5 on admission. Iron studies were not confirmatory of iron deficiency.   #Seizure Disorder  Spot EEG did not reveal seizure.He was continued on his carbamazepine  500 mg BID.    Discharge Subjective: Patient reports feel well today. He no longer feels lightheaded and is able to walk without the feeling. He is ready to go home. We dicussed the importance of drinking a glass of water per hours while awake and trying to eat three meals a day.    I discussed the plan for discharge with the recommendations of monitoring/ensuring patient is drinking enough water and eating 2-3 meals per day with his brothers Hanna and Kevin(lives with).   Discharge Exam:   Blood pressure 113/78, pulse (!) 108, temperature 98.3 F (36.8 C), resp. rate 18, height 6' 2 (1.88 m), weight 72 kg, SpO2 100%.  Constitutional:Well-appearing, sitting  in bed, in no acute distress HENT: mucous membranes dry Cardiovascular: regular rate and rhythm, no m/r/g Pulmonary/Chest: normal work of breathing on room air, speaking in full sentences  Neurological: alert & awake, participating in conversation  MSK: no gross abnormalities. No  pitting edema present  Psych: Normal mood and affect  Pertinent Labs, Studies, and Procedures:     Latest Ref Rng & Units 12/14/2023    9:14 AM 12/13/2023   10:23 AM 04/21/2023    4:24 AM  CBC  WBC 4.0 - 10.5 K/uL 6.7  10.9  7.3   Hemoglobin 13.0 - 17.0 g/dL 87.1  87.4  88.0   Hematocrit 39.0 - 52.0 % 38.4  37.5  37.1   Platelets 150 - 400 K/uL 301  276  226        Latest Ref Rng & Units 12/14/2023    9:14 AM 12/13/2023    4:29 PM 12/13/2023   10:23 AM  CMP  Glucose 70 - 99 mg/dL 893  899  882   BUN 8 - 23 mg/dL 7  8  9    Creatinine 0.61 - 1.24 mg/dL 9.02  9.04  9.12   Sodium 135 - 145 mmol/L 136  128  127   Potassium 3.5 - 5.1 mmol/L 4.2  4.2  4.6   Chloride 98 - 111 mmol/L 103  94  93   CO2 22 - 32 mmol/L 20  23  24    Calcium  8.9 - 10.3 mg/dL 9.1  8.9  9.1     ECHOCARDIOGRAM COMPLETE Result Date: 12/14/2023    ECHOCARDIOGRAM REPORT   Patient Name:   Gregory Spence Date of Exam: 12/14/2023 Medical Rec #:  [id number]     Height:       74.0 in Accession #:    [id number]    Weight:       158.7 lb Date of Birth:  December 18, 1955     BSA:          1.970 m Patient Age:    67 years      BP:           120/76 mmHg Patient Gender: M             HR:           98 bpm. Exam Location:  Inpatient Procedure: 2D Echo, Cardiac Doppler and Color Doppler (Both Spectral and Color            Flow Doppler were utilized during procedure). Indications:    R55 Syncope  History:        Patient has no prior history of Echocardiogram examinations.  Sonographer:    Eva Lash Referring Phys: [id number] JEFFREY C HATCHER IMPRESSIONS  1. Increased LVOT velocities without evidence for HOCM. Left ventricular ejection fraction, by estimation, is >75%. Left ventricular ejection fraction by 2D MOD biplane is 75.6 %. The left ventricle has hyperdynamic function. The left ventricle has no regional wall motion abnormalities. There is mild left ventricular hypertrophy of the basal-septal segment. Left ventricular diastolic parameters were  normal.  2. Right ventricular systolic function is normal. The right ventricular size is normal. Tricuspid regurgitation signal is inadequate for assessing PA pressure.  3. Mild SAM. The mitral valve is grossly normal. No evidence of mitral valve regurgitation.  4. The aortic valve is tricuspid. Aortic valve regurgitation is not visualized. No aortic stenosis is present.  5. The inferior vena cava is normal in size with greater than 50% respiratory variability,  suggesting right atrial pressure of 3 mmHg.  6. Increased flow velocities may be secondary to anemia, thyrotoxicosis, hyperdynamic or high flow state. Comparison(s): No prior Echocardiogram. FINDINGS  Left Ventricle: Increased LVOT velocities without evidence for HOCM. Left ventricular ejection fraction, by estimation, is >75%. Left ventricular ejection fraction by 2D MOD biplane is 75.6 %. The left ventricle has hyperdynamic function. The left ventricle has no regional wall motion abnormalities. The left ventricular internal cavity size was normal in size. There is mild left ventricular hypertrophy of the basal-septal segment. Left ventricular diastolic parameters were normal. Right Ventricle: The right ventricular size is normal. No increase in right ventricular wall thickness. Right ventricular systolic function is normal. Tricuspid regurgitation signal is inadequate for assessing PA pressure. Left Atrium: Left atrial size was normal in size. Right Atrium: Right atrial size was normal in size. Pericardium: There is no evidence of pericardial effusion. Mitral Valve: Mild SAM. The mitral valve is grossly normal. No evidence of mitral valve regurgitation. Tricuspid Valve: The tricuspid valve is grossly normal. Tricuspid valve regurgitation is trivial. Aortic Valve: The aortic valve is tricuspid. Aortic valve regurgitation is not visualized. No aortic stenosis is present. Aortic valve mean gradient measures 3.0 mmHg. Aortic valve peak gradient measures 6.6  mmHg. Aortic valve area, by VTI measures 3.04 cm. Pulmonic Valve: The pulmonic valve was normal in structure. Pulmonic valve regurgitation is not visualized. Aorta: The aortic root and ascending aorta are structurally normal, with no evidence of dilitation. Venous: The inferior vena cava is normal in size with greater than 50% respiratory variability, suggesting right atrial pressure of 3 mmHg. IAS/Shunts: No atrial level shunt detected by color flow Doppler.  LEFT VENTRICLE PLAX 2D                        Biplane EF (MOD) LVIDd:         3.20 cm         LV Biplane EF:   Left LVIDs:         2.00 cm                          ventricular LV PW:         1.00 cm                          ejection LV IVS:        1.10 cm                          fraction by LVOT diam:     1.90 cm                          2D MOD LV SV:         62                               biplane is LV SV Index:   32                               75.6 %. LVOT Area:     2.84 cm  Diastology                                LV e' medial:    8.59 cm/s LV Volumes (MOD)               LV E/e' medial:  9.2 LV vol d, MOD    65.3 ml       LV e' lateral:   15.80 cm/s A2C:                           LV E/e' lateral: 5.0 LV vol d, MOD    72.5 ml A4C: LV vol s, MOD    16.8 ml A2C: LV vol s, MOD    16.2 ml A4C: LV SV MOD A2C:   48.5 ml LV SV MOD A4C:   72.5 ml LV SV MOD BP:    52.5 ml RIGHT VENTRICLE RV S prime:     13.20 cm/s TAPSE (M-mode): 1.9 cm LEFT ATRIUM             Index LA Vol (A2C):   32.5 ml 16.50 ml/m LA Vol (A4C):   25.0 ml 12.69 ml/m LA Biplane Vol: 28.9 ml 14.67 ml/m  AORTIC VALVE AV Area (Vmax):    2.72 cm AV Area (Vmean):   2.58 cm AV Area (VTI):     3.04 cm AV Vmax:           128.00 cm/s AV Vmean:          86.100 cm/s AV VTI:            0.204 m AV Peak Grad:      6.6 mmHg AV Mean Grad:      3.0 mmHg LVOT Vmax:         123.00 cm/s LVOT Vmean:        78.300 cm/s LVOT VTI:          0.219 m LVOT/AV VTI ratio: 1.07 MITRAL  VALVE MV Area (PHT): 4.60 cm    SHUNTS MV Decel Time: 165 msec    Systemic VTI:  0.22 m MV E velocity: 79.00 cm/s  Systemic Diam: 1.90 cm MV A velocity: 76.20 cm/s MV E/A ratio:  1.04 Vinie Maxcy MD Electronically signed by Vinie Maxcy MD Signature Date/Time: 12/14/2023/11:49:24 AM    Final    EEG adult Result Date: 12/13/2023 Gregory Arlin KIDD, MD     12/13/2023  9:45 PM Patient Name: Gregory Spence MRN: [id number] Epilepsy Attending: Arlin Spence Gregory Referring Physician/Provider: Kandis Perkins, DO Date: 12/13/2023 Duration: 25.21 mins Patient history: 68 year-old male with PMH of seizure disorder, HTN, HLD, and mild intellectual disability who presents for lightheadedness and a fall. EEG to evaluate for seizure. Level of alertness: Awake, drowsy AEDs during EEG study: CBZ Technical aspects: This EEG study was done with scalp electrodes positioned according to the 10-20 International system of electrode placement. Electrical activity was reviewed with band pass filter of 1-70Hz , sensitivity of 7 uV/mm, display speed of 61mm/sec with a 60Hz  notched filter applied as appropriate. EEG data were recorded continuously and digitally stored.  Video monitoring was available and reviewed as appropriate. Description: The posterior dominant rhythm consists of 8 Hz activity of moderate voltage (25-35 uV) seen predominantly in posterior head regions, symmetric and reactive to eye opening and eye closing. Drowsiness was characterized by attenuation of the posterior background rhythm. EEG  showed intermittent generalized 3 to 6 Hz theta-delta slowing. Physiologic photic driving was not seen during photic stimulation.  Hyperventilation was not performed.   ABNORMALITY - Intermittent slow, generalized IMPRESSION: This study is suggestive of mild diffuse encephalopathy. No seizures or epileptiform discharges were seen throughout the recording. Priyanka O Yadav   CT Head Wo Contrast Result Date: 12/13/2023 CLINICAL DATA:  Head  trauma, fall. EXAM: CT HEAD WITHOUT CONTRAST TECHNIQUE: Contiguous axial images were obtained from the base of the skull through the vertex without intravenous contrast. RADIATION DOSE REDUCTION: This exam was performed according to the departmental dose-optimization program which includes automated exposure control, adjustment of the mA and/or kV according to patient size and/or use of iterative reconstruction technique. COMPARISON:  CT head 04/20/2023. FINDINGS: Brain: No acute intracranial hemorrhage. No CT evidence of acute infarct. No edema, mass effect, or midline shift. The basilar cisterns are patent. Ventricles: The ventricles are normal. Vascular: Atherosclerotic calcifications of the carotid siphons. No hyperdense vessel. Skull: No acute or aggressive finding. Orbits: Orbits are symmetric. Sinuses: Mucosal thickening throughout the paranasal sinuses most pronounced in the ethmoid and maxillary sinuses. Other: Mastoid air cells are clear. IMPRESSION: No CT evidence of acute intracranial abnormality. Paranasal sinus disease increased from prior. Electronically Signed   By: Donnice Mania M.D.   On: 12/13/2023 10:25     Discharge Instructions: Discharge Instructions     Call MD for:  difficulty breathing, headache or visual disturbances   Complete by: As directed    Call MD for:  extreme fatigue   Complete by: As directed    Call MD for:  hives   Complete by: As directed    Call MD for:  persistant dizziness or light-headedness   Complete by: As directed    Call MD for:  persistant nausea and vomiting   Complete by: As directed    Call MD for:  redness, tenderness, or signs of infection (pain, swelling, redness, odor or green/yellow discharge around incision site)   Complete by: As directed    Call MD for:  severe uncontrolled pain   Complete by: As directed    Call MD for:  temperature >100.4   Complete by: As directed    Diet - low sodium heart healthy   Complete by: As directed     Discharge instructions   Complete by: As directed    You came to the hospital for a fall and you were diagnosed with dehydration.  We treated you with IV fluids.   *For your dehydration and low sodium -Please try to drink at least once glass of water per hour or at least 1.5 L of water daily -Continue to wear TED hoses -Please eat three meals a day -Ensure you follow up with your PCP within the next 1-2 weeks   If you have any questions or concerns please feel free to call: Internal medicine clinic at [phone]  If you have any of these following symptoms, please call us  or seek care at an emergency department: -Chest Pain -Difficulty Breathing -Syncope (passing out) -Drooping of face -Slurred speech -Sudden weakness in your leg or arm -Fever -Chills  We are glad that you are feeling better, it was a pleasure to care for you!  Damien Lease DO   Increase activity slowly   Complete by: As directed        Signed: Damien Lease DO [facility] - PGY1 12/20/2023, 12:30 PM    Please contact the pager at  [phone].      

## 2023-12-14 NOTE — Plan of Care (Signed)
  Problem: Education: Goal: Knowledge of General Education information will improve Description: Including pain rating scale, medication(s)/side effects and non-pharmacologic comfort measures 12/14/2023 0742 by Benjamine Shu, RN Outcome: Progressing 12/14/2023 0741 by Benjamine Shu, RN Outcome: Progressing   Problem: Clinical Measurements: Goal: Ability to maintain clinical measurements within normal limits will improve Outcome: Progressing   Problem: Activity: Goal: Risk for activity intolerance will decrease Outcome: Progressing   Problem: Coping: Goal: Level of anxiety will decrease Outcome: Progressing   Problem: Safety: Goal: Ability to remain free from injury will improve Outcome: Progressing

## 2023-12-14 NOTE — Evaluation (Signed)
 Physical Therapy Evaluation Patient Details Name: Gregory Spence MRN: [id number] DOB: [birth year] Today's Date: 12/14/2023  History of Present Illness  The pt is a 68 yo male presenting 6/20 after a fall at home. Work up revealed possible orthostatic hypotension, admitted for further work up. PMH includes: anemia, HTN, and seizures.  Clinical Impression  Pt in bed upon arrival of PT, agreeable to evaluation at this time. 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 The pt was eager to mobilize, already long-sitting in bed upon my arrival, and able to finish transition to sitting EOB to eat without assistance. Pt with great strength bilaterally in LE and with good power and stability with sit-stand transfer. Pt with significant drop in BP with 3 min stand (see values below) but reports feeling only a little tired not dizzy with this drop. Further mobility deferred at this time, RN present and I requested thigh-high ted hose (pt only with knee-high at present) as well as abdominal binder for mobility. Suspect that once BP managed, pt will be safe to return home with family support, could progress to no follow up PT needed as pt largely at supervision/CGA level today, limited only by concern for safety due to BP drop.   VITALS:  - supine in bed - n/a pt long-sitting upon my arrival - sitting EOB - BP: 113/78 (86); HR: 108bpm - standing - BP: 107/62 (74); HR: 121bpm - standing after 3 min - BP: 63/41 (48); HR: 112bpm *reports feeling a little tired - sitting EOB - BP: 125/68 (84); HR: 107bpm        If plan is discharge home, recommend the following: A little help with walking and/or transfers;A little help with bathing/dressing/bathroom;Help with stairs or ramp for entrance   Can travel by private vehicle        Equipment Recommendations None recommended by PT   Recommendations for Other Services       Functional Status Assessment Patient has had a recent decline in their functional status and demonstrates the ability to make significant improvements in function in a reasonable and predictable amount of time.     Precautions / Restrictions Precautions Precautions: Fall Recall of Precautions/Restrictions: Intact Precaution/Restrictions Comments: orthostatic Restrictions Weight Bearing Restrictions Per Provider Order: No      Mobility  Bed Mobility Overal bed mobility: Modified Independent             General bed mobility comments: long sitting in bed upon my arrival (why supine orthostatics not taken) then able to complete transition without any assistance    Transfers Overall transfer level: Needs assistance Equipment used: None Transfers: Sit to/from Stand, Bed to chair/wheelchair/BSC Sit to Stand: Supervision   Step pivot transfers: Contact guard assist       General transfer comment: supervision for stand, stable balance, then CGA for pivot to chair due to low BP    Ambulation/Gait Ambulation/Gait assistance: Contact guard assist Gait Distance (Feet): 4 Feet Assistive device: None Gait Pattern/deviations: Step-through pattern, Decreased stride length       General Gait Details: limited due to orthostatics, pt reports feeling a little tired but no other sx. CGA for safety    Balance Overall balance assessment: Needs assistance, History of Falls Sitting-balance support: No upper extremity supported, Feet supported Sitting balance-Leahy Scale: Good     Standing balance support: No upper extremity supported Standing balance-Leahy Scale:  Fair Standing balance comment: limited by orthostatics, stable with static stance without UE support, not fully assessed                             Pertinent Vitals/Pain Pain Assessment Pain Assessment: No/denies pain    Home Living Family/patient expects to be  discharged to:: Private residence Living Arrangements: Other relatives (brother) Available Help at Discharge: Family;Available 24 hours/day Type of Home: House Home Access: Ramped entrance       Home Layout: One level Home Equipment: Shower seat      Prior Function Prior Level of Function : Independent/Modified Independent             Mobility Comments: no use of DME, reports up walking around most of day. 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 (pushing [facility]) etc ADLs Comments: mod I basic ADLs, does his own medications, takes a cab to work where he does part time work for [employer] and rec     Extremity/Trunk Assessment   Upper Extremity Assessment Upper Extremity Assessment: Defer to OT evaluation    Lower Extremity Assessment Lower Extremity Assessment: Overall WFL for tasks assessed    Cervical / Trunk Assessment Cervical / Trunk Assessment: Normal  Communication   Communication Communication: No apparent difficulties    Cognition Arousal: Alert Behavior During Therapy: WFL for tasks assessed/performed   PT - Cognitive impairments: No family/caregiver present to determine baseline                       PT - Cognition Comments: pt able to answer simple questions, no family present to confirm baseline Following commands: Impaired Following commands impaired: Follows one step commands with increased time     Cueing Cueing Techniques: Verbal cues     General Comments General comments (skin integrity, edema, etc.): orthostatic with standing, had knee-high ted hose, will need thigh high and abdominal binder    Exercises     Assessment/Plan    PT Assessment Patient needs continued PT services  PT Problem List Decreased activity tolerance;Decreased balance;Decreased safety awareness       PT Treatment Interventions DME instruction;Gait training;Stair training;Functional mobility  training;Therapeutic activities;Therapeutic exercise;Balance training;Patient/family education    PT Goals (Current goals can be found in the Care Plan section)  Acute Rehab PT Goals Patient Stated Goal: return home PT Goal Formulation: With patient Time For Goal Achievement: 12/28/23 Potential to Achieve Goals: Good    Frequency Min 2X/week        AM-PAC PT 6 Clicks Mobility  Outcome Measure Help needed turning from your back to your side while in a flat bed without using bedrails?: None Help needed moving from lying on your back to sitting on the side of a flat bed without using bedrails?: None Help needed moving to and from a bed to a chair (including a wheelchair)?: A Little Help needed standing up from a chair using your arms (e.g., wheelchair or bedside chair)?: A Little Help needed to walk in hospital room?: Total (<20 ft today) Help needed climbing 3-5 steps with a railing? : Total 6 Click Score: 16    End of Session Equipment Utilized During Treatment: Gait belt Activity Tolerance: Other (comment) (orthostatic hypotension) Patient left: in chair;with call bell/phone within reach;with chair alarm set Nurse Communication: Mobility status (thigh high ted hose and abdominal binder, orthostatics) PT Visit Diagnosis: Unsteadiness  on feet (R26.81)    Time: 9159-9087 PT Time Calculation (min) (ACUTE ONLY): 32 min   Charges:   PT Evaluation $PT Eval Moderate Complexity: 1 Mod PT Treatments $Therapeutic Activity: 8-22 mins PT General Charges $$ ACUTE PT VISIT: 1 Visit         Izetta Call, PT, DPT   Acute Rehabilitation Department Office [phone] Secure Chat Communication Preferred  Izetta JULIANNA Call 12/14/2023, 9:20 AM

## 2023-12-14 NOTE — Care Management Obs Status (Signed)
 MEDICARE OBSERVATION STATUS NOTIFICATION   Patient Details  Name: Gregory Spence MRN: [id number] Date of Birth: 1955-10-09   Medicare Observation Status Notification Given:  Yes    Jon Cruel 12/14/2023, 11:46 AM

## 2023-12-14 NOTE — Discharge Instructions (Signed)
 You came to the hospital for a fall and you were diagnosed with dehydration.  We treated you with IV fluids.   *For your dehydration and low sodium -Please try to drink at least once glass of water per hour or at least 1.5 L of water daily -Continue to wear TED hoses -Please eat three meals a day -Ensure you follow up with your PCP within the next 1-2 weeks   If you have any questions or concerns please feel free to call: Internal medicine clinic at [phone]  If you have any of these following symptoms, please call us  or seek care at an emergency department: -Chest Pain -Difficulty Breathing -Syncope (passing out) -Drooping of face -Slurred speech -Sudden weakness in your leg or arm -Fever -Chills  We are glad that you are feeling better, it was a pleasure to care for you!  Damien Lease DO

## 2023-12-14 NOTE — Evaluation (Signed)
 Occupational Therapy Evaluation and Discharge Patient Details Name: Gregory Spence MRN: [id number] DOB: 02/04/1956 Today's Date: 12/14/2023   History of Present Illness   The pt is a 68 yo male presenting 6/20 after a fall at home. Work up revealed possible orthostatic hypotension, admitted for further work up. PMH includes: anemia, HTN, and seizures.     Clinical Impressions At baseline, pt is Independent with ADLs, functional mobility without an AD, and medication management. 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 Pt now presents at or near baseline PLOF with pt largely demonstrating ability to complete ADLs and functional mobility/transfers without an AD Independent to Mod I. However, OT recommends close Supervision to Contact guard assist for activities in standing and functional transfers/mobility at this time for safety due to episode of asymptomatic orthostatic hypotension in standing this session, as well as earlier this day during PT session. Pt with knee-high TED hose donned this session. OT educated pt and his brother in further discussing pt's BP with physician and in potential use of thigh-high TED hose and abdominal binder to address orthostatic hypotension. No further benefit from acute skilled OT services at this time and no post acute skilled OT needs anticipated. OT is signing off at this time.   BP in supine: 146/73 (93) BP in sitting: 141/71 (92) BP in standing: 125/67 (77) BP in standing at 3 minutes: 121/68 (82) After ambulation of approx. 15 feet, BP in standing 120/69 (82)      If plan is discharge home, recommend the following:   A little help with walking and/or transfers;A little help with bathing/dressing/bathroom;Assistance with cooking/housework;Assist for transportation;Help with stairs or ramp for entrance     Functional Status Assessment   Patient has not had a recent decline in their functional status     Equipment Recommendations   None  recommended by OT     Recommendations for Other Services         Precautions/Restrictions   Precautions Precautions: Fall Recall of Precautions/Restrictions: Intact Precaution/Restrictions Comments: orthostatic Restrictions Weight Bearing Restrictions Per Provider Order: No     Mobility Bed Mobility Overal bed mobility: Modified Independent                  Transfers Overall transfer level: Needs assistance Equipment used: None Transfers: Sit to/from Stand, Bed to chair/wheelchair/BSC Sit to Stand: Supervision     Step pivot transfers: Supervision, Contact guard assist     General transfer comment: Supervision to CGA for safety due to orthostatic hypotension in standing this session      Balance Overall balance assessment: Needs assistance, History of Falls Sitting-balance support: No upper extremity supported, Feet supported Sitting balance-Leahy Scale: Good     Standing balance support: No upper extremity supported Standing balance-Leahy Scale: Fair Standing balance comment: limited by orthostatics, stable with static stance without UE support, not fully assessed                           ADL either performed or assessed with clinical judgement   ADL Overall ADL's : Needs assistance/impaired                                       General ADL Comments: Pt is largely Independent to Mod I with ADLs. However, OT recommends close Supervision for ADLs and close Sueprvision to CGA for functional  transfers/mobility and activities in standing at this time for safety due to pt with episode of orthostatic hypotension in standing this session.     Vision Ability to See in Adequate Light: 0 Adequate Patient Visual Report: No change from baseline       Perception         Praxis         Pertinent Vitals/Pain Pain Assessment Pain Assessment: No/denies pain     Extremity/Trunk Assessment Upper Extremity Assessment Upper  Extremity Assessment: Right hand dominant;Overall [facility] for tasks assessed   Lower Extremity Assessment Lower Extremity Assessment: Defer to PT evaluation   Cervical / Trunk Assessment Cervical / Trunk Assessment: Normal   Communication Communication Communication: No apparent difficulties;Other (comment) (Requires increased time for processing)   Cognition Arousal: Alert Behavior During Therapy: WFL for tasks assessed/performed Cognition: History of cognitive impairments             OT - Cognition Comments: Pt AAOx4 and pleasant throughout session. Pt with hx of cognitive impairment with cognition at baseline per family report.                 Following commands: Impaired Following commands impaired: Follows one step commands with increased time     Cueing  General Comments   Cueing Techniques: Verbal cues  Pt with asymptomatic episode of orthostatic hypotension with standing this session while wearing knee-high TED hose. Pt may benefit from thigh-high TED hose and abdominal binder   Exercises     Shoulder Instructions      Home Living Family/patient expects to be discharged to:: Private residence Living Arrangements: Other relatives (brother) Available Help at Discharge: Family;Available 24 hours/day Type of Home: House Home Access: Ramped entrance     Home Layout: One level     Bathroom Shower/Tub: Producer, television/film/video: Standard     Home Equipment: Shower seat          Prior Functioning/Environment Prior Level of Function : Independent/Modified Independent             Mobility Comments: no use of DME, reports up walking around most of day. 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 (pushing [facility]) etc ADLs Comments: Mod I basic ADLs and manages his own medications, takes a cab to work where he does part time work for [employer]    OT Problem List:     OT  Treatment/Interventions:        OT Goals(Current goals can be found in the care plan section)   Acute Rehab OT Goals Patient Stated Goal: to return home and go back to work OT Goal Formulation: All assessment and education complete, DC therapy   OT Frequency:       Co-evaluation              AM-PAC OT 6 Clicks Daily Activity     Outcome Measure Help from another person eating meals?: None Help from another person taking care of personal grooming?: None (in sitting) Help from another person toileting, which includes using toliet, bedpan, or urinal?: A Little Help from another person bathing (including washing, rinsing, drying)?: A Little Help from another person to put on and taking off regular upper body clothing?: None (in sitting) Help from another person to put on and taking off regular lower body clothing?: A Little 6 Click Score: 21   End of Session Equipment Utilized During Treatment: Gait belt;Rolling  walker (2 wheels) Nurse Communication: Mobility status;Other (comment) (Pt with episode of orthostatic hypotension in standing this session)  Activity Tolerance: Patient tolerated treatment well;Treatment limited secondary to medical complications (Comment) (Pt limited by episode of orthostatic hypotension in standing) Patient left: in bed;with call bell/phone within reach;with bed alarm set;with family/visitor present  OT Visit Diagnosis: Other abnormalities of gait and mobility (R26.89)                Time: 8666-8645 OT Time Calculation (min): 21 min Charges:  OT General Charges $OT Visit: 1 Visit OT Evaluation $OT Eval Low Complexity: 1 Low  Margarie Rockey HERO., OTR/L, MA Acute Rehab [phone]   Margarie FORBES Horns 12/14/2023, 5:28 PM

## 2023-12-14 NOTE — Progress Notes (Signed)
*  PRELIMINARY RESULTS* Echocardiogram 2D Echocardiogram has been performed.  Gregory Spence 12/14/2023, 10:31 AM

## 2024-02-10 ENCOUNTER — Ambulatory Visit: Payer: 59 | Admitting: Family Medicine

## 2024-04-09 NOTE — Progress Notes (Signed)
 PATIENT: Gregory Spence DOB: 10-May-1956  REASON FOR VISIT: follow up HISTORY FROM: patient  Chief Complaint  Patient presents with   Seizures    RM2, BROTHER PRESENT, SZ: NO RECENT SZ OR CONCERNS       HISTORY OF PRESENT ILLNESS:  04/21/24 ALL: Gregory Spence returns for follow up for seizures. He was last see 04/2023 in setting of breakthrough activity and concerns of cbz toxicity. He was switched to levetiracetam  500mg  BID. Stanley called 11/2023 to verify which medication he should be taking and he was advised to continue levetiracetam  500mg  BID. It is unclear why, but he has continued carbamazepine  500mg  twice daily.   Since, he reports doing well. He denies seizure activity. He is tolerating cbz well. He was seen by ER 11/2023 for a fall. Questionable syncope d/t orthostatic hypotension. CBZ level was 14.7. Initial sodium 127. After IVF sodium returned to 136. EEG and Echo normal. Carbamazepine  500mg  BID continued.   05/01/2023 ALL:  Gregory Spence returns for an acute visit following breakthrough seizure activity and concerns of carbamazepine  toxicity. He was seen by me in the office 04/17/23 after home health NP had visited with concerns of him not taking medications consistently. He reported to me that he was taking carbamazepine  500mg  at 8am and 8pm, everyday. We advised he continue current treatment plan. Labs revealed cbz level was low and we advised his brother to make sure he was taking medicaitons as prescribed.   He was seen 04/20/2023 by [facility]. Brother found him down. He was awake and speaking but unable to stand due to weakness and unsteady gait. Patient assumed he had a seizure. He reported restarting cbx 2 days prior after being without medication for a few weeks. CBZ level 19.3. He was switched to levetiracetam . CBZ level 7.1 at discharge. CT head and c-spine negative. He was advised to stop cbz and continue lev 500mg  BID.   04/17/2023 ALL:  Gregory Spence returns for follow up for seizures.  He was last seen 03/2021 and doing well on carbamazepine  500mg  BID. Per Epic review, he was seen 04/02/2023 by Alfredia, NP with [facility] for a annual home visit and Saverio reported not taking carbamezapine. PCP sent referral back to our office.   He presents with his brother, [facility]). He has a sister, Delon [facility]) and brother Richard([place]) who help take care of him as well. He lives with twin brother, [facility]). Neither Vinie or [address] drive. Derico reports he is doing well. He has not had any recent seizure activity. Gregory Spence reports last seizure was 5-6 years ago. Per our documentation, last seizure in 1990. He reports taking carbamazepine  daily at 8am and 8pm. He is tolerating it well. No recent seizure activity. He is followed regularly by Dr Jolee.   04/10/2021 ALL: Gregory Spence returns for follow up for seizures. He continues carbamazepine  500mg  BID. He is tolerating well and without obvious adverse effects. He denies seizure activity since last being seen. He is living with his twin brother Rupert. His sister recently moved back to [place]. He is able to manage his medications. He does not drive. He is here with his friend, today.   He was seen in the ER 01/26/2021 following a fall in the setting of weakness from diarrhea x 1 week. No major injuries and workup was unremarkable. Na was 133. He has an appt with Dr Jolee 10/24 for follow up and blood work. He reports feeling much better and diarrhea is resolved.   10/01/2019 ALL:  Gregory Spence is a 68 y.o. male here today for follow up for seizures. He continues carbamazepine  500mg  twice daily. He is tolerating medication well with no obvious adverse effects. He denies seizure activity. He is seen yearly by PCP. No longer seeing hematology. He has received his first of two vaccines and anticipates getting the second on 4/19. He is living with is sister. He is feeling well today.   HISTORY: (copied from Dr Chancy note  on 08/11/2018)  UPDATE (08/11/18, VRP): Since last visit, doing well. Symptoms are stable. No alleviating or aggravating factors. Tolerating CBZ. No seizures.   UPDATE (07/29/17, CM): 68 year old male returns for yearly follow-up. He reestablished care here with  Dr. Margaret 09/25/2013. He had a previous patient of ours with Dr. Susen who has left the practice. He has a history of static encephalopathy and mild mental retardation and seizure disorder that has been well-controlled on carbamazepine . Last seizure event occurred September 1990. He also has a congenital left hemiparesis. Denies any daytime drowsiness headaches visual disturbance no falls. Denies missing any doses of his medications. He returns for reevaluation. Mother passed away in Jun 24, 2014. She was primary caretaker. CBC  followed through oncology for his anemia. He needs liver function testing and refills. He has no new complaints.  He reports appetite is good.    PRIOR HPI (09/25/13, VRP): 68 year old male with history of mild mental retardation, static encephalopathy, mild congential left hemiparesis, seizure disorder, here to establish again for seizure disorder management.   Patient takes carbamazepine  200 mg tablets, 2.5 tablets twice a day. Patient has not had a seizure in over 20 years (last seizure 1990?). Otherwise patient is doing well. He continues to work for [employer] [place].   First seizure of life around age 12 years old, with generalized convulsive seizures. No warning sign for seizures. Patient was initially on Dilantin and then transition to carbamazepine . No seizure since starting carbamazepine .   REVIEW OF SYSTEMS: Out of a complete 14 system review of symptoms, the patient complains only of the following symptoms, none and all other reviewed systems are negative.  ALLERGIES: No Known Allergies  HOME MEDICATIONS: Outpatient Medications Prior to Visit  Medication Sig Dispense Refill   atorvastatin   (LIPITOR) 80 MG tablet TAKE 1 TABLET BY MOUTH EVERY DAY NEED APPT FOR MORE REFILLS  0   carbamazepine  (TEGRETOL ) 200 MG tablet Take 500 mg by mouth 2 (two) times daily.     No facility-administered medications prior to visit.    PAST MEDICAL HISTORY: Past Medical History:  Diagnosis Date   Anemia    Hypertension    Seizures (HCC)     PAST SURGICAL HISTORY: History reviewed. No pertinent surgical history.  FAMILY HISTORY: Family History  Problem Relation Age of Onset   Heart disease Mother    Diabetes Mother    Other Mother        infection   Colon cancer Sister    Colon cancer Brother    Colon polyps Neg Hx    Esophageal cancer Neg Hx    Stomach cancer Neg Hx    Rectal cancer Neg Hx    Seizures Neg Hx     SOCIAL HISTORY: Social History   Socioeconomic History   Marital status: Single    Spouse name: Not on file   Number of children: 0   Years of education: 12th   Highest education level: Not on file  Occupational History    Employer: [employer]  Tobacco Use   Smoking status: Never   Smokeless tobacco: Never  Vaping Use   Vaping status: Never Used  Substance and Sexual Activity   Alcohol use: No   Drug use: No   Sexual activity: Not on file  Other Topics Concern   Not on file  Social History Narrative   Patient lives at home with father, twin brother.    Soda every now and then   Never used EtOH, tobacco or illicit drugs.   Attended special education classes; graduated from 12th grade.   Social Drivers of Corporate Investment Banker Strain: Not on file  Food Insecurity: No Food Insecurity (12/13/2023)   Hunger Vital Sign    Worried About Running Out of Food in the Last Year: Never true    Ran Out of Food in the Last Year: Never true  Transportation Needs: No Transportation Needs (12/13/2023)   PRAPARE - Administrator, Civil Service (Medical): No    Lack of Transportation (Non-Medical): No  Physical Activity: Not on file   Stress: Not on file  Social Connections: Patient Declined (12/14/2023)   Social Connection and Isolation Panel    Frequency of Communication with Friends and Family: Patient declined    Frequency of Social Gatherings with Friends and Family: Patient declined    Attends Religious Services: Patient declined    Database Administrator or Organizations: Patient declined    Attends Banker Meetings: Patient declined    Marital Status: Patient declined  Intimate Partner Violence: Not At Risk (12/13/2023)   Humiliation, Afraid, Rape, and Kick questionnaire    Fear of Current or Ex-Partner: No    Emotionally Abused: No    Physically Abused: No    Sexually Abused: No      PHYSICAL EXAM  Vitals:   04/21/24 0848  BP: 138/76  Pulse: 93  Resp: 14  SpO2: 99%  Weight: 161 lb (73 kg)  Height: 6' 2 (1.88 m)      Body mass index is 20.67 kg/m.   Generalized: Well developed, in no acute distress  Cardiology: normal rate and rhythm, no murmur noted Respiratory: clear to auscultation bilaterally  Neurological examination  Mentation: Alert, Oriented to self, place and time. Slow responses, Mild MR. Follows all commands speech and language fluent Cranial nerve II-XII: Pupils were equal round reactive to light. Extraocular movements were full, visual field were full on confrontational test. Facial sensation and strength were normal.  Head turning and shoulder shrug  were normal and symmetric. Motor: The motor testing reveals 5 over 5 strength of all 4 extremities. Good symmetric motor tone is noted throughout.  Sensory: Sensory testing is intact to soft touch on all 4 extremities. No evidence of extinction is noted.  Coordination: Cerebellar testing reveals good finger-nose-finger and heel-to-shin bilaterally.  Gait and station: Gait is narrow but stable.    DIAGNOSTIC DATA (LABS, IMAGING, TESTING) - I reviewed patient records, labs, notes, testing and imaging myself where  available.      No data to display           Lab Results  Component Value Date   WBC 6.7 12/14/2023   HGB 12.8 (L) 12/14/2023   HCT 38.4 (L) 12/14/2023   MCV 88.3 12/14/2023   PLT 301 12/14/2023      Component Value Date/Time   NA 136 12/14/2023 0914   NA 142 04/17/2023 0828   NA 140 12/17/2013 1026   K 4.2 12/14/2023 0914  K 4.1 12/17/2013 1026   CL 103 12/14/2023 0914   CO2 20 (L) 12/14/2023 0914   CO2 27 12/17/2013 1026   GLUCOSE 106 (H) 12/14/2023 0914   GLUCOSE 91 12/17/2013 1026   BUN 7 (L) 12/14/2023 0914   BUN 15 04/17/2023 0828   BUN 6.8 (L) 12/17/2013 1026   CREATININE 0.97 12/14/2023 0914   CREATININE 1.0 12/17/2013 1026   CALCIUM  9.1 12/14/2023 0914   CALCIUM  9.3 12/17/2013 1026   PROT 6.9 04/20/2023 2145   PROT 7.3 04/17/2023 0828   PROT 7.6 12/17/2013 1026   ALBUMIN 3.5 04/20/2023 2145   ALBUMIN 4.5 04/17/2023 0828   ALBUMIN 3.9 12/17/2013 1026   AST 34 04/20/2023 2145   AST 25 12/17/2013 1026   ALT 34 04/20/2023 2145   ALT 20 12/17/2013 1026   ALKPHOS 67 04/20/2023 2145   ALKPHOS 83 12/17/2013 1026   BILITOT 1.1 04/20/2023 2145   BILITOT 1.1 04/17/2023 0828   BILITOT 0.26 12/17/2013 1026   GFRNONAA >60 12/14/2023 0914   GFRAA 96 10/01/2019 0814   No results found for: CHOL, HDL, LDLCALC, LDLDIRECT, TRIG, CHOLHDL No results found for: YHAJ8R Lab Results  Component Value Date   VITAMINB12 554 12/17/2013   Lab Results  Component Value Date   TSH 0.991 12/14/2023       ASSESSMENT AND PLAN 68 y.o. year old male  has a past medical history of Anemia, Hypertension, and Seizures (HCC). here with     ICD-10-CM   1. Seizure disorder (HCC)  G40.909 Carbamazepine  level, total    CMP    Levetiracetam  level    2. Therapeutic drug monitoring  Z51.81       Kamonte is doing well, today. There was some miscommunication and he has continued carbamazepine  500mg  twice daily instead of levetiracetam . He seems to be doing well and  tolerating medication but did have a recent fall with hyponatremia slightly elevated carbamazepine  levels. I will update labs, today. If abnormal, I recommend switching him to levetiracetam . I have had a lengthy discussion with his brother, Taft, regarding need for close monitoring that Bertrand is taking medications as prescribed. He lives with Rupert, brother, who is now assisting with medication administration. He was encouraged to stay well hydrated and work on healthy lifestyle habits with well balanced diet and regular exercise. He will follow up with me in 6 months.    Orders Placed This Encounter  Procedures   Carbamazepine  level, total   CMP   Levetiracetam  level      No orders of the defined types were placed in this encounter.     I spent 30 minutes of face-to-face and non-face-to-face time with patient.  This included previsit chart review, lab review, study review, order entry, electronic health record documentation, patient education.   Greig Forbes, FNP-C 04/21/2024, 9:36 AM [facility] [address], [address] [place], [place] [address] [phone]

## 2024-04-09 NOTE — Patient Instructions (Signed)
 Below is our plan:  We will continue current treatment plan for now. If labs are not back to normal, I would like to switch you to levetiracetam . I will call and discuss lab results as soon as they are available.   Please make sure you are consistent with timing of seizure medication. I recommend annual visit with primary care provider (PCP) for complete physical and routine blood work. I recommend daily intake of vitamin D (400-800iu) and calcium  (800-1000mg ) for bone health. Discuss Dexa screening with PCP.   According to [place] law, you can not drive unless you are seizure / syncope free for at least 6 months and under physician's care.  Please maintain precautions. Do not participate in activities where a loss of awareness could harm you or someone else. No swimming alone, no tub bathing, no hot tubs, no driving, no operating motorized vehicles (cars, ATVs, motocycles, etc), lawnmowers, power tools or firearms. No standing at heights, such as rooftops, ladders or stairs. Avoid hot objects such as stoves, heaters, open fires. Wear a helmet when riding a bicycle, scooter, skateboard, etc. and avoid areas of traffic. Set your water heater to 120 degrees or less.  SUDEP is the sudden, unexpected death of someone with epilepsy, who was otherwise healthy. In SUDEP cases, no other cause of death is found when an autopsy is done. Each year, more than 1 in 1,000 people with epilepsy die from SUDEP. This is the leading cause of death in people with uncontrolled seizures. Until further answers are available, the best way to prevent SUDEP is to lower your risk by controlling seizures. Research has found that people with all types of epilepsy that experience convulsive seizures can be at risk.  Please make sure you are staying well hydrated. I recommend 50-60 ounces daily. Well balanced diet and regular exercise encouraged. Consistent sleep schedule with 6-8 hours recommended.   Please continue follow up with care  team as directed.   Follow up with me in 6 months   You may receive a survey regarding today's visit. I encourage you to leave honest feed back as I do use this information to improve patient care. Thank you for seeing me today!

## 2024-04-21 ENCOUNTER — Encounter: Payer: Self-pay | Admitting: Family Medicine

## 2024-04-21 ENCOUNTER — Ambulatory Visit (INDEPENDENT_AMBULATORY_CARE_PROVIDER_SITE_OTHER): Payer: 59 | Admitting: Family Medicine

## 2024-04-21 VITALS — BP 138/76 | HR 93 | Resp 14 | Ht 74.0 in | Wt 161.0 lb

## 2024-04-21 DIAGNOSIS — G40909 Epilepsy, unspecified, not intractable, without status epilepticus: Secondary | ICD-10-CM

## 2024-04-21 DIAGNOSIS — Z5181 Encounter for therapeutic drug level monitoring: Secondary | ICD-10-CM

## 2024-04-22 LAB — COMPREHENSIVE METABOLIC PANEL WITH GFR
ALT: 25 IU/L (ref 0–44)
AST: 22 IU/L (ref 0–40)
Albumin: 4.6 g/dL (ref 3.9–4.9)
Alkaline Phosphatase: 93 IU/L (ref 47–123)
BUN/Creatinine Ratio: 13 (ref 10–24)
BUN: 14 mg/dL (ref 8–27)
Bilirubin Total: 0.3 mg/dL (ref 0.0–1.2)
CO2: 26 mmol/L (ref 20–29)
Calcium: 9.7 mg/dL (ref 8.6–10.2)
Chloride: 103 mmol/L (ref 96–106)
Creatinine, Ser: 1.08 mg/dL (ref 0.76–1.27)
Globulin, Total: 3.3 g/dL (ref 1.5–4.5)
Glucose: 99 mg/dL (ref 70–99)
Potassium: 4.6 mmol/L (ref 3.5–5.2)
Sodium: 142 mmol/L (ref 134–144)
Total Protein: 7.9 g/dL (ref 6.0–8.5)
eGFR: 74 mL/min/1.73 (ref >59)

## 2024-04-22 LAB — CARBAMAZEPINE LEVEL, TOTAL: Carbamazepine (Tegretol), S: 12.4 ug/mL (ref 4.0–12.0)

## 2024-04-22 LAB — LEVETIRACETAM LEVEL: Levetiracetam Lvl: <2 ug/mL — ABNORMAL LOW (ref 10.0–40.0)

## 2024-04-23 ENCOUNTER — Ambulatory Visit: Payer: Self-pay | Admitting: Family Medicine

## 2024-04-23 DIAGNOSIS — G40909 Epilepsy, unspecified, not intractable, without status epilepticus: Secondary | ICD-10-CM

## 2024-04-23 MED ORDER — CARBAMAZEPINE 200 MG PO TABS: 400.0000 mg | ORAL_TABLET | Freq: Two times a day (BID) | ORAL | 3 refills | Status: DC

## 2024-04-27 ENCOUNTER — Other Ambulatory Visit: Payer: Self-pay | Admitting: Family Medicine

## 2024-06-16 ENCOUNTER — Telehealth: Payer: Self-pay | Admitting: Family Medicine

## 2024-06-16 DIAGNOSIS — G40909 Epilepsy, unspecified, not intractable, without status epilepticus: Secondary | ICD-10-CM

## 2024-06-16 DIAGNOSIS — Z5181 Encounter for therapeutic drug level monitoring: Secondary | ICD-10-CM

## 2024-06-16 NOTE — Telephone Encounter (Signed)
 Can you guys contact his brother and ask him to bring Gregory Spence by our office for labs. I would like to recheck his carbamazepine  level, sodium and white blood cell count. I am placing orders in the system. TY!

## 2024-06-16 NOTE — Telephone Encounter (Signed)
 Spoke with brother Taft, he will bring patient on 06/22/24 @ 10am for labs. Lab appointment scheduled.

## 2024-06-22 ENCOUNTER — Other Ambulatory Visit (INDEPENDENT_AMBULATORY_CARE_PROVIDER_SITE_OTHER): Payer: Self-pay

## 2024-06-22 DIAGNOSIS — Z0289 Encounter for other administrative examinations: Secondary | ICD-10-CM

## 2024-06-23 ENCOUNTER — Ambulatory Visit: Payer: Self-pay | Admitting: Family Medicine

## 2024-06-23 ENCOUNTER — Other Ambulatory Visit: Payer: Self-pay | Admitting: Family Medicine

## 2024-06-23 LAB — CBC WITH DIFFERENTIAL/PLATELET
Basophils Absolute: 0 x10E3/uL (ref 0.0–0.2)
Basos: 1 %
EOS (ABSOLUTE): 0.1 x10E3/uL (ref 0.0–0.4)
Eos: 4 %
Hematocrit: 38.5 % (ref 37.5–51.0)
Hemoglobin: 12.4 g/dL — ABNORMAL LOW (ref 13.0–17.7)
Immature Grans (Abs): 0 x10E3/uL (ref 0.0–0.1)
Immature Granulocytes: 0 %
Lymphocytes Absolute: 1 x10E3/uL (ref 0.7–3.1)
Lymphs: 35 %
MCH: 28.8 pg (ref 26.6–33.0)
MCHC: 32.2 g/dL (ref 31.5–35.7)
MCV: 90 fL (ref 79–97)
Monocytes Absolute: 0.3 x10E3/uL (ref 0.1–0.9)
Monocytes: 10 %
Neutrophils Absolute: 1.4 x10E3/uL (ref 1.4–7.0)
Neutrophils: 50 %
Platelets: 339 x10E3/uL (ref 150–450)
RBC: 4.3 x10E6/uL (ref 4.14–5.80)
RDW: 12.4 % (ref 11.6–15.4)
WBC: 2.9 x10E3/uL — ABNORMAL LOW (ref 3.4–10.8)

## 2024-06-23 LAB — COMPREHENSIVE METABOLIC PANEL WITH GFR
ALT: 18 IU/L (ref 0–44)
AST: 21 IU/L (ref 0–40)
Albumin: 4.6 g/dL (ref 3.9–4.9)
Alkaline Phosphatase: 87 IU/L (ref 47–123)
BUN/Creatinine Ratio: 13 (ref 10–24)
BUN: 14 mg/dL (ref 8–27)
Bilirubin Total: 0.3 mg/dL (ref 0.0–1.2)
CO2: 24 mmol/L (ref 20–29)
Calcium: 9.9 mg/dL (ref 8.6–10.2)
Chloride: 89 mmol/L — ABNORMAL LOW (ref 96–106)
Creatinine, Ser: 1.11 mg/dL (ref 0.76–1.27)
Globulin, Total: 2.8 g/dL (ref 1.5–4.5)
Glucose: 123 mg/dL — ABNORMAL HIGH (ref 70–99)
Potassium: 4.5 mmol/L (ref 3.5–5.2)
Sodium: 125 mmol/L — ABNORMAL LOW (ref 134–144)
Total Protein: 7.4 g/dL (ref 6.0–8.5)
eGFR: 72 mL/min/1.73

## 2024-06-23 LAB — CARBAMAZEPINE LEVEL, TOTAL: Carbamazepine (Tegretol), S: 12.8 ug/mL (ref 4.0–12.0)

## 2024-06-23 MED ORDER — CARBAMAZEPINE 200 MG PO TABS: 200.0000 mg | ORAL_TABLET | Freq: Two times a day (BID) | ORAL | Status: AC

## 2024-06-23 MED ORDER — LEVETIRACETAM ER 750 MG PO TB24: 750.0000 mg | ORAL_TABLET | Freq: Every day | ORAL | 3 refills | Status: AC

## 2024-08-21 ENCOUNTER — Inpatient Hospital Stay

## 2024-08-21 ENCOUNTER — Inpatient Hospital Stay: Admitting: Family

## 2024-11-03 ENCOUNTER — Ambulatory Visit: Admitting: Diagnostic Neuroimaging
